# Patient Record
Sex: Male | Born: 1960 | Race: White | Hispanic: No | Marital: Married | Smoking: Never smoker
Health system: Southern US, Community
[De-identification: ages and names within clinical notes are randomized; demographics above are authoritative.]

## PROBLEM LIST (undated history)

## (undated) DIAGNOSIS — I10 Essential (primary) hypertension: Secondary | ICD-10-CM

## (undated) DIAGNOSIS — E785 Hyperlipidemia, unspecified: Secondary | ICD-10-CM

## (undated) DIAGNOSIS — F419 Anxiety disorder, unspecified: Secondary | ICD-10-CM

## (undated) DIAGNOSIS — E119 Type 2 diabetes mellitus without complications: Secondary | ICD-10-CM

## (undated) HISTORY — PX: COLONOSCOPY: SHX174

## (undated) HISTORY — DX: Hyperlipidemia, unspecified: E78.5

## (undated) HISTORY — PX: POLYPECTOMY: SHX149

---

## 2004-09-20 ENCOUNTER — Ambulatory Visit: Payer: Self-pay | Admitting: Family Medicine

## 2009-09-10 ENCOUNTER — Ambulatory Visit: Payer: Self-pay | Admitting: Unknown Physician Specialty

## 2009-09-10 LAB — HM COLONOSCOPY

## 2014-03-19 LAB — TSH: TSH: 2.76 u[IU]/mL (ref <=5.90)

## 2014-03-19 LAB — PSA: PSA: 0.4

## 2014-07-02 LAB — HEMOGLOBIN A1C: Hgb A1c MFr Bld: 6.6 % — AB (ref 4.0–6.0)

## 2014-08-03 LAB — BASIC METABOLIC PANEL
BUN: 17 mg/dL (ref 4–21)
Creatinine: 0.9 mg/dL (ref <=1.3)
Glucose: 161 mg/dL
POTASSIUM: 5 mmol/L (ref 3.4–5.3)
Sodium: 139 mmol/L (ref 137–147)

## 2014-10-05 LAB — LIPID PANEL
Cholesterol: 158 mg/dL (ref 0–200)
HDL: 47 mg/dL (ref 35–70)
LDL CALC: 87 mg/dL
LDL/HDL RATIO: 1.9
Triglycerides: 120 mg/dL (ref 40–160)

## 2014-10-05 LAB — HEPATIC FUNCTION PANEL
ALT: 47 U/L — AB (ref 10–40)
AST: 24 U/L (ref 14–40)
Alkaline Phosphatase: 75 U/L (ref 25–125)
BILIRUBIN, TOTAL: 0.5 mg/dL

## 2014-10-08 DIAGNOSIS — E785 Hyperlipidemia, unspecified: Secondary | ICD-10-CM | POA: Insufficient documentation

## 2014-10-08 DIAGNOSIS — F419 Anxiety disorder, unspecified: Secondary | ICD-10-CM | POA: Insufficient documentation

## 2014-10-08 DIAGNOSIS — E119 Type 2 diabetes mellitus without complications: Secondary | ICD-10-CM | POA: Insufficient documentation

## 2014-10-08 DIAGNOSIS — N529 Male erectile dysfunction, unspecified: Secondary | ICD-10-CM | POA: Insufficient documentation

## 2014-12-15 ENCOUNTER — Ambulatory Visit: Payer: Self-pay | Admitting: Family Medicine

## 2014-12-24 ENCOUNTER — Ambulatory Visit (INDEPENDENT_AMBULATORY_CARE_PROVIDER_SITE_OTHER): Payer: 59 | Admitting: Family Medicine

## 2014-12-24 ENCOUNTER — Encounter: Payer: Self-pay | Admitting: Family Medicine

## 2014-12-24 VITALS — BP 138/92 | HR 72 | Temp 97.9°F | Resp 14 | Ht 66.25 in | Wt 176.0 lb

## 2014-12-24 DIAGNOSIS — N529 Male erectile dysfunction, unspecified: Secondary | ICD-10-CM

## 2014-12-24 DIAGNOSIS — R7303 Prediabetes: Secondary | ICD-10-CM

## 2014-12-24 DIAGNOSIS — K648 Other hemorrhoids: Secondary | ICD-10-CM

## 2014-12-24 DIAGNOSIS — R7309 Other abnormal glucose: Secondary | ICD-10-CM

## 2014-12-24 DIAGNOSIS — E785 Hyperlipidemia, unspecified: Secondary | ICD-10-CM

## 2014-12-24 DIAGNOSIS — F419 Anxiety disorder, unspecified: Secondary | ICD-10-CM

## 2014-12-24 DIAGNOSIS — K6289 Other specified diseases of anus and rectum: Secondary | ICD-10-CM

## 2014-12-24 DIAGNOSIS — N528 Other male erectile dysfunction: Secondary | ICD-10-CM

## 2014-12-24 LAB — POCT UA - MICROALBUMIN: Microalbumin Ur, POC: 50 mg/L

## 2014-12-24 LAB — POCT GLYCOSYLATED HEMOGLOBIN (HGB A1C): Hemoglobin A1C: 6.4

## 2014-12-24 MED ORDER — LOSARTAN POTASSIUM 50 MG PO TABS
50.0000 mg | ORAL_TABLET | Freq: Every day | ORAL | Status: DC
Start: 2014-12-24 — End: 2015-09-24

## 2014-12-24 NOTE — Progress Notes (Signed)
Patient ID: Jason Glenn, male   DOB: [birth year], 54 y.o.   MRN: [id number]    Subjective:  HPI   Diabetes Mellitus Type II, Follow-up:   Lab Results  Component Value Date   HGBA1C 6.6* 07/02/2014    Last seen for diabetes 6 months ago.  Management changes included none. He reports good compliance with treatment. Not on any medications. Current symptoms include none and have been stable. Home blood sugar records: not been checked  Episodes of hypoglycemia? no   Current Insulin Regimen: none Most Recent Eye Exam: over a year ago per patient. Weight trend: stable Prior visit with dietician: yes years ago per patient. Current diet: does not have a specific diet but tries to eat better. Current exercise: active at work with walking but not a set routine of exercise.  Pertinent Labs:    Component Value Date/Time   CHOL 158 10/05/2014   TRIG 120 10/05/2014   CREATININE 0.9 08/03/2014    Wt Readings from Last 3 Encounters:  12/24/14 176 lb (79.833 kg)  10/06/14 181 lb (82.101 kg)    Elevated liver enzymes follow up: Last Hepatic check was in May 2016-liver enzymes were better, ALT was still up some. He does not drink alcohol, he uses tylenol products rarely.    Rectal pain:  Patient states he still is having soreness in his rectal area that he discussed with [place] in May.   This comes and goes still. No burning, no itching, no hemorrhoids that he can tell, no bleeding, no specific pattern to it, his bowels have been normal and having a bowel movement actually helps the soreness.  Patient states on the scale of 1 to 10 sometimes pain goes up to 7 and today right now it is about a 3.  His last colonoscopy was in April 2011 and he had internal hemorrhoids and tubular adenoma and was advised to repeat colonoscopy in 5 years.  Patient wanted to discuss that.    Prior to Admission medications   Medication Sig Start Date End Date Taking? Authorizing Provider  aspirin 81 MG  tablet Take 81 mg by mouth daily.   Yes Historical Provider, MD  atorvastatin (LIPITOR) 10 MG tablet Take by mouth. 09/15/14  Yes Historical Provider, MD  Omega-3 Fatty Acids (FISH OIL) 1000 MG CPDR Take by mouth.   Yes Historical Provider, MD  Potassium Gluconate 550 MG TABS Take by mouth.   Yes Historical Provider, MD  sildenafil (VIAGRA) 50 MG tablet Take by mouth. 12/28/11  Yes Historical Provider, MD  tadalafil (CIALIS) 5 MG tablet Take by mouth. 02/26/14  Yes Historical Provider, MD    Patient Active Problem List   Diagnosis Date Noted  . Anxiety 10/08/2014  . 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 HLD (hyperlipidemia) 10/08/2014  . Diabetes mellitus, type 2 10/08/2014    No past medical history on file.  Social History   Social History  . Marital Status: Married    Spouse Name: N/A  . Number of Children: N/A  . Years of Education: N/A   Occupational History  . Not on file.   Social History Main Topics  . Smoking status: Never Smoker   . Smokeless tobacco: Never Used  . Alcohol Use: No  . Drug Use: No  . Sexual Activity: Yes   Other Topics Concern  . Not on file   Social History Narrative    Allergies  Allergen Reactions  . Pravastatin Sodium  Other reaction(s): Muscle Cramps    Review of Systems  Constitutional: Negative for fever, chills and malaise/fatigue.  Respiratory: Negative for cough, hemoptysis, sputum production, shortness of breath and wheezing.   Cardiovascular: Negative for chest pain, palpitations and claudication.  Gastrointestinal: Negative for heartburn, nausea, abdominal pain and blood in stool.  Genitourinary: Negative for dysuria, urgency and frequency.  Musculoskeletal: Negative for myalgias, back pain, joint pain, falls and neck pain.  Skin: Negative for itching and rash.  Neurological: Negative for dizziness, tingling, tremors, weakness and headaches.    Immunization History  Administered Date(s) Administered   . Td 12/02/2003, 12/09/2012   Objective:  BP 138/92 mmHg  Pulse 72  Temp(Src) 97.9 F (36.6 C)  Resp 14  Ht 5' 6.25" (1.683 m)  Wt 176 lb (79.833 kg)  BMI 28.18 kg/m2  Physical Exam  Constitutional: He is oriented to person, place, and time and well-developed, well-nourished, and in no distress.  HENT:  Head: Normocephalic and atraumatic.  Eyes: Conjunctivae are normal. Pupils are equal, round, and reactive to light.  Neck: Normal range of motion. Neck supple.  Cardiovascular: Normal rate, regular rhythm, normal heart sounds and intact distal pulses.  Exam reveals no gallop and no friction rub.   No murmur heard. Pulmonary/Chest: Effort normal and breath sounds normal. No respiratory distress. He has no wheezes. He has no rales. He exhibits no tenderness.  Abdominal: Soft. Bowel sounds are normal. He exhibits no distension and no mass. There is no tenderness. There is no rebound and no guarding.  Genitourinary: Rectal exam shows internal hemorrhoid.  Musculoskeletal: Normal range of motion. He exhibits no edema or tenderness.  Neurological: He is alert and oriented to person, place, and time. Gait normal.  Skin: No rash noted. No erythema.  Psychiatric: Mood, memory, affect and judgment normal.   internal hemorrhoid noted on visual exam exam of the anus. There is no active bleeding. No fissure noted. No DRE performed. Nothing on exam that would definitively explain anal pain/discomfort  Lab Results  Component Value Date   CHOL 158 10/05/2014   TRIG 120 10/05/2014   HDL 47 10/05/2014   LDLCALC 87 10/05/2014   TSH 2.76 03/19/2014   PSA 0.4 03/19/2014   HGBA1C 6.6* 07/02/2014    CMP     Component Value Date/Time   NA 139 08/03/2014   K 5.0 08/03/2014   BUN 17 08/03/2014   CREATININE 0.9 08/03/2014   AST 24 10/05/2014   ALT 47* 10/05/2014   ALKPHOS 75 10/05/2014    Assessment and Plan :  1. Prediabetes A1C 6.4 better today. Continue working on habits. He is pre  diabetic now. Urine microalbumin is 50 today will add Losartan 50 mg. - POCT HgB A1C - POCT UA - Microalbumin - COMPLETE METABOLIC PANEL WITH GFR - losartan (COZAAR) 50 MG tablet; Take 1 tablet (50 mg total) by mouth daily.  Dispense: 90 tablet; Refill: 3  2. Anxiety Stable. Chronic. - COMPLETE METABOLIC PANEL WITH GFR - TSH  3. ED (erectile dysfunction) of organic origin Stable. Uses Cialis and Viagra as needed.  4. Rectal pain Off and on. He due for colonoscopy also. Will refer back to GI-Dr. Shelle Iron and also since he is having rectal issues. Patient has suppositories at home and advised patient to use that and also take hot bath instead of shower for a few days. - Ambulatory referral to Gastroenterology  5. Internal hemorrhoid  On the exam today. Refer to GI. - Ambulatory referral to Gastroenterology -  CBC with Differential/Platelet  6. Hyperlipidemia Hx of this. - Lipid Panel With LDL/HDL Ratio      Patient was seen and examined by Dr. Bosie Clos and note was scribed by Samara Deist, RMA.    Julieanne Manson MD [facility] 12/24/2014 8:47 AM

## 2014-12-25 LAB — COMPREHENSIVE METABOLIC PANEL
ALK PHOS: 66 IU/L (ref 39–117)
ALT: 44 IU/L (ref 0–44)
AST: 24 IU/L (ref 0–40)
Albumin/Globulin Ratio: 1.6 (ref 1.1–2.5)
Albumin: 4.6 g/dL (ref 3.5–5.5)
BUN / CREAT RATIO: 17 (ref 9–20)
BUN: 17 mg/dL (ref 6–24)
Bilirubin Total: 0.9 mg/dL (ref 0.0–1.2)
CALCIUM: 9 mg/dL (ref 8.7–10.2)
CHLORIDE: 99 mmol/L (ref 97–108)
CO2: 24 mmol/L (ref 18–29)
Creatinine, Ser: 0.98 mg/dL (ref 0.76–1.27)
GFR calc Af Amer: 99 mL/min/{1.73_m2} (ref >59)
GFR calc non Af Amer: 86 mL/min/{1.73_m2} (ref >59)
Globulin, Total: 2.8 g/dL (ref 1.5–4.5)
Glucose: 138 mg/dL — ABNORMAL HIGH (ref 65–99)
Potassium: 5 mmol/L (ref 3.5–5.2)
SODIUM: 139 mmol/L (ref 134–144)
Total Protein: 7.4 g/dL (ref 6.0–8.5)

## 2014-12-25 LAB — CBC WITH DIFFERENTIAL/PLATELET
BASOS ABS: 0 10*3/uL (ref 0.0–0.2)
BASOS: 1 %
EOS (ABSOLUTE): 0.3 10*3/uL (ref 0.0–0.4)
Eos: 4 %
Hematocrit: 43.3 % (ref 37.5–51.0)
Hemoglobin: 14.8 g/dL (ref 12.6–17.7)
IMMATURE GRANS (ABS): 0 10*3/uL (ref 0.0–0.1)
Immature Granulocytes: 0 %
LYMPHS: 40 %
Lymphocytes Absolute: 2.5 10*3/uL (ref 0.7–3.1)
MCH: 30.1 pg (ref 26.6–33.0)
MCHC: 34.2 g/dL (ref 31.5–35.7)
MCV: 88 fL (ref 79–97)
Monocytes Absolute: 0.4 10*3/uL (ref 0.1–0.9)
Monocytes: 7 %
NEUTROS PCT: 48 %
Neutrophils Absolute: 3 10*3/uL (ref 1.4–7.0)
Platelets: 249 10*3/uL (ref 150–379)
RBC: 4.91 x10E6/uL (ref 4.14–5.80)
RDW: 13.6 % (ref 12.3–15.4)
WBC: 6.1 10*3/uL (ref 3.4–10.8)

## 2014-12-25 LAB — LIPID PANEL WITH LDL/HDL RATIO
Cholesterol, Total: 201 mg/dL — ABNORMAL HIGH (ref 100–199)
HDL: 51 mg/dL (ref >39)
LDL Calculated: 134 mg/dL — ABNORMAL HIGH (ref 0–99)
LDl/HDL Ratio: 2.6 ratio units (ref 0.0–3.6)
Triglycerides: 81 mg/dL (ref 0–149)
VLDL Cholesterol Cal: 16 mg/dL (ref 5–40)

## 2014-12-25 LAB — TSH: TSH: 2.27 u[IU]/mL (ref 0.450–4.500)

## 2015-03-23 LAB — HM COLONOSCOPY

## 2015-04-22 ENCOUNTER — Encounter: Payer: Self-pay | Admitting: Family Medicine

## 2015-04-22 ENCOUNTER — Ambulatory Visit (INDEPENDENT_AMBULATORY_CARE_PROVIDER_SITE_OTHER): Payer: 59 | Admitting: Family Medicine

## 2015-04-22 VITALS — BP 116/72 | HR 65 | Temp 98.3°F | Resp 16 | Wt 180.4 lb

## 2015-04-22 DIAGNOSIS — B029 Zoster without complications: Secondary | ICD-10-CM | POA: Diagnosis not present

## 2015-04-22 MED ORDER — VALACYCLOVIR HCL 1 G PO TABS: 1000.0000 mg | ORAL_TABLET | Freq: Three times a day (TID) | ORAL | Status: DC

## 2015-04-22 NOTE — Patient Instructions (Signed)
Let me know if you have worsening of your nerve pain.

## 2015-04-22 NOTE — Progress Notes (Signed)
Subjective:     Patient ID: Jason Glenn, male   DOB: 09/19/1960, 54 y.o.   MRN: [id number]  HPI  Chief Complaint  Patient presents with  . Skin Problem    Patient comes in office with concerns of multiple lesions on skin that appeared 3-4days ago. Patient reports that he initially thought there were pimples, but area now is raised, sore and red. Patient has a lesion on his chest and 2 on his left upper arm.   States they have a burning sensation but not keeping him up at night. Reports his wife has had shingles in the past and this is similar.  Review of Systems  Skin:       No exposure to or hx of MRSA.       Objective:   Physical Exam  Constitutional: He appears well-developed and well-nourished.  Skin:  Left chest and left proximal upper arm with patch of erythematous rash with overlying vesicles.       Assessment:    1. Shingles - valACYclovir (VALTREX) 1000 MG tablet; Take 1 tablet (1,000 mg total) by mouth 3 (three) times daily.  Dispense: 21 tablet; Refill: 0    Plan:    Discussed infection control measures. Will call if sx worsen.

## 2015-05-03 ENCOUNTER — Ambulatory Visit (INDEPENDENT_AMBULATORY_CARE_PROVIDER_SITE_OTHER): Payer: 59 | Admitting: Family Medicine

## 2015-05-03 ENCOUNTER — Telehealth: Payer: Self-pay | Admitting: Family Medicine

## 2015-05-03 ENCOUNTER — Encounter: Payer: Self-pay | Admitting: Family Medicine

## 2015-05-03 VITALS — BP 138/88 | HR 60 | Temp 97.8°F | Resp 14 | Wt 179.0 lb

## 2015-05-03 DIAGNOSIS — E785 Hyperlipidemia, unspecified: Secondary | ICD-10-CM

## 2015-05-03 DIAGNOSIS — N528 Other male erectile dysfunction: Secondary | ICD-10-CM

## 2015-05-03 DIAGNOSIS — E119 Type 2 diabetes mellitus without complications: Secondary | ICD-10-CM | POA: Diagnosis not present

## 2015-05-03 DIAGNOSIS — H9313 Tinnitus, bilateral: Secondary | ICD-10-CM

## 2015-05-03 DIAGNOSIS — N529 Male erectile dysfunction, unspecified: Secondary | ICD-10-CM

## 2015-05-03 LAB — POCT GLYCOSYLATED HEMOGLOBIN (HGB A1C): HEMOGLOBIN A1C: 7.1

## 2015-05-03 MED ORDER — GLUCOSE BLOOD VI STRP: ORAL_STRIP | Status: DC

## 2015-05-03 MED ORDER — ATORVASTATIN CALCIUM 40 MG PO TABS: 40.0000 mg | ORAL_TABLET | Freq: Every day | ORAL | Status: DC

## 2015-05-03 NOTE — Progress Notes (Signed)
Patient ID: Jason Glenn, male   DOB: [name], 54 y.o.   MRN: [id number]    Subjective:  HPI  Diabetes follow up: Patient was last seen in August. Losartan was started due to urine microalbumin been 50 at that time. He has not been checking his sugars due to not having strips but used his wife's the other day and sugar was 136. Lab Results  Component Value Date   HGBA1C 6.4 12/24/2014   Ringing in the ears Patient states this has been going on for years and we have referred him to ENT for this but patient states doctor there told him he could not help him and he has not seen an audiologist. He states his aunt use to work for a doctor who is out of state and per patient specialized in medicine and patient was advised to try Lipo-flavonoid tablets, he is suppose to take 1 tablet with meals but he has not been taking it that way. He has been taking it off and on for 2 months. He has noticed some improvement with taking this medication.  Prior to Admission medications   Medication Sig Start Date End Date Taking? Authorizing Provider  aspirin 81 MG tablet Take 81 mg by mouth daily.   Yes Historical Provider, MD  atorvastatin (LIPITOR) 10 MG tablet Take by mouth. 09/15/14  Yes Historical Provider, MD  losartan (COZAAR) 50 MG tablet Take 1 tablet (50 mg total) by mouth daily. 12/24/14  Yes Richard Hulen ShoutsL Gilbert Jr., MD  Omega-3 Fatty Acids (FISH OIL) 1000 MG CPDR Take by mouth.   Yes Historical Provider, MD  polyethylene glycol powder (GLYCOLAX/MIRALAX) powder  02/24/15  Yes Historical Provider, MD  Potassium Gluconate 550 MG TABS Take by mouth.   Yes Historical Provider, MD  sildenafil (VIAGRA) 50 MG tablet Take by mouth. 12/28/11  Yes Historical Provider, MD  tadalafil (CIALIS) 5 MG tablet Take by mouth. 02/26/14  Yes Historical Provider, MD  valACYclovir (VALTREX) 1000 MG tablet Take 1 tablet (1,000 mg total) by mouth 3 (three) times daily. 04/22/15  Yes Anola Gurneyobert Chauvin, PA  Vitamins-Lipotropics  (LIPO-FLAVONOID PLUS) TABS Take by mouth once.   Yes Historical Provider, MD    Patient Active Problem List   Diagnosis Date Noted  . Anxiety 10/08/2014  . 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 HLD (hyperlipidemia) 10/08/2014  . Diabetes mellitus, type 2 (HCC) 10/08/2014    Past Medical History  Diagnosis Date  . Hyperlipidemia     Social History   Social History  . Marital Status: Married    Spouse Name: N/A  . Number of Children: N/A  . Years of Education: N/A   Occupational History  . Not on file.   Social History Main Topics  . Smoking status: Never Smoker   . Smokeless tobacco: Never Used  . Alcohol Use: No  . Drug Use: No  . Sexual Activity: Yes   Other Topics Concern  . Not on file   Social History Narrative    Allergies  Allergen Reactions  . Pravastatin Sodium     Other reaction(s): Muscle Cramps    Review of Systems  Constitutional: Negative.   HENT: Positive for tinnitus.   Respiratory: Negative.   Cardiovascular: Negative.   Gastrointestinal: Negative.   Musculoskeletal: Negative.   Neurological: Negative.   Psychiatric/Behavioral: Negative.     Immunization History  Administered Date(s) Administered  . Td 12/02/2003, 12/09/2012   Objective:  BP 138/88 mmHg  Pulse 60  Temp(Src) 97.8 F (36.6 C)  Resp 14  Wt 179 lb (81.194 kg)  Physical Exam  Constitutional: He is oriented to person, place, and time and well-developed, well-nourished, and in no distress.  HENT:  Head: Normocephalic and atraumatic.  Right Ear: External ear normal.  Left Ear: External ear normal.  Nose: Nose normal.  Eyes: Conjunctivae are normal. Pupils are equal, round, and reactive to light. Right eye exhibits nystagmus (mild). Left eye exhibits nystagmus (mild).  Neck: Normal range of motion. Neck supple.  Cardiovascular: Normal rate, regular rhythm, normal heart sounds and intact distal pulses.   No murmur heard. Pulmonary/Chest: Effort  normal and breath sounds normal. No respiratory distress. He has no wheezes.  Abdominal: Soft. Bowel sounds are normal. He exhibits no distension and no mass. There is no tenderness.  Musculoskeletal: Normal range of motion. He exhibits no edema or tenderness.  Neurological: He is alert and oriented to person, place, and time. Gait normal.  Skin: Skin is warm and dry.  Psychiatric: Mood, memory, affect and judgment normal.    Lab Results  Component Value Date   WBC 6.1 12/24/2014   HCT 43.3 12/24/2014   GLUCOSE 138* 12/24/2014   CHOL 201* 12/24/2014   TRIG 81 12/24/2014   HDL 51 12/24/2014   LDLCALC 134* 12/24/2014   TSH 2.270 12/24/2014   PSA 0.4 03/19/2014   HGBA1C 6.4 12/24/2014    CMP     Component Value Date/Time   NA 139 12/24/2014 0936   K 5.0 12/24/2014 0936   CL 99 12/24/2014 0936   CO2 24 12/24/2014 0936   GLUCOSE 138* 12/24/2014 0936   BUN 17 12/24/2014 0936   CREATININE 0.98 12/24/2014 0936   CREATININE 0.9 08/03/2014   CALCIUM 9.0 12/24/2014 0936   PROT 7.4 12/24/2014 0936   ALBUMIN 4.6 12/24/2014 0936   AST 24 12/24/2014 0936   ALT 44 12/24/2014 0936   ALKPHOS 66 12/24/2014 0936   BILITOT 0.9 12/24/2014 0936   GFRNONAA 86 12/24/2014 0936   GFRAA 99 12/24/2014 0936    Assessment and Plan :  1. Type 2 diabetes mellitus without complication, without long-term current use of insulin (HCC) A1C 7.1 was 6.4. Worse. Advised patient to check sugar at home and will follow for now. - POCT HgB A1C  2. HLD (hyperlipidemia) Will increase Lipitor to 40 mg since his sugar increased and levels still elevated for his cholesterol.  3. ED (erectile dysfunction) of organic origin Stable.  4. Tinnitus, bilateral Discussed this with patient in detail.. Encouraged patient to continue the Lipo medication regularly. Will follow as needed.  Patient denies decreased hearing. Consider ENT or audiology referral. I have done the exam and reviewed the above chart and it is  accurate to the best of my knowledge.   Patient was seen and examined by Dr. Bosie Clos and note was scribed by Samara Deist, RMA.    Julieanne Manson MD [facility] 05/03/2015 8:46 AM

## 2015-05-03 NOTE — Telephone Encounter (Signed)
Pt stated he needs orders for testing strips for his One Touch Ultra to be sent to [facility]. Thanks TNP

## 2015-05-03 NOTE — Patient Instructions (Signed)
Wait to get flu shot in about 2 to 3 weeks from now due to recent shingels break out.

## 2015-05-03 NOTE — Telephone Encounter (Signed)
RX sent in-aa 

## 2015-05-26 LAB — HM DIABETES EYE EXAM

## 2015-07-30 ENCOUNTER — Other Ambulatory Visit: Payer: Self-pay | Admitting: Family Medicine

## 2015-07-30 NOTE — Telephone Encounter (Signed)
Please review-aa 

## 2015-07-30 NOTE — Telephone Encounter (Signed)
Pt contacted office for refill request on the following medications:  sildenafil (VIAGRA) 50 MG tablet.  [facility] [address]  ZO#[phone]/WJCB#[phone]/MW

## 2015-07-31 NOTE — Telephone Encounter (Signed)
Ok for 1 year. 

## 2015-08-02 MED ORDER — SILDENAFIL CITRATE 50 MG PO TABS: 50.0000 mg | ORAL_TABLET | ORAL | Status: DC | PRN

## 2015-08-02 NOTE — Telephone Encounter (Signed)
Done-aa 

## 2015-08-10 ENCOUNTER — Ambulatory Visit (INDEPENDENT_AMBULATORY_CARE_PROVIDER_SITE_OTHER): Payer: 59 | Admitting: Family Medicine

## 2015-08-10 VITALS — BP 130/78 | HR 68 | Temp 97.9°F | Resp 16 | Wt 176.0 lb

## 2015-08-10 DIAGNOSIS — R0981 Nasal congestion: Secondary | ICD-10-CM

## 2015-08-10 DIAGNOSIS — E785 Hyperlipidemia, unspecified: Secondary | ICD-10-CM | POA: Diagnosis not present

## 2015-08-10 DIAGNOSIS — M791 Myalgia, unspecified site: Secondary | ICD-10-CM

## 2015-08-10 DIAGNOSIS — J0120 Acute ethmoidal sinusitis, unspecified: Secondary | ICD-10-CM | POA: Diagnosis not present

## 2015-08-10 DIAGNOSIS — E119 Type 2 diabetes mellitus without complications: Secondary | ICD-10-CM

## 2015-08-10 MED ORDER — AMOXICILLIN-POT CLAVULANATE 875-125 MG PO TABS: 1.0000 | ORAL_TABLET | Freq: Two times a day (BID) | ORAL | Status: DC

## 2015-08-10 NOTE — Progress Notes (Signed)
Patient ID: Jason Glenn, male   DOB: 10-17-1960, 55 y.o.   MRN: [id number]   Jason Glenn  MRN: [id number] DOB: 10-02-1960  Subjective:  HPI  1. Type 2 diabetes mellitus without complication, without long-term current use of insulin (HCC) The patient is a newly diagnosed diabetic.  His last visit was on 05/03/15 and that is when he was diagnosed.  His A1C was 7.1 at that time.  He has been checking his glucose and it has been running 140's-190's.  The patient has not had any hypoglycemic events since we last saw him.    2. Hyperlipidemia Since becoming diabetic the patient had been instructed to increase his Lipitor to 40 mg daily.    3. Sinus congestion The patient complains of sinus congestion and pressure today.  He first developed symptoms 3 weeks ago but yesterday he was running a fever and he felt like the congestion was settling in his chest.   Patient Active Problem List   Diagnosis Date Noted  . Anxiety 10/08/2014  . 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 HLD (hyperlipidemia) 10/08/2014  . Diabetes mellitus, type 2 (HCC) 10/08/2014    Past Medical History  Diagnosis Date  . Hyperlipidemia     Social History   Social History  . Marital Status: Married    Spouse Name: N/A  . Number of Children: N/A  . Years of Education: N/A   Occupational History  . Not on file.   Social History Main Topics  . Smoking status: Never Smoker   . Smokeless tobacco: Never Used  . Alcohol Use: No  . Drug Use: No  . Sexual Activity: Yes   Other Topics Concern  . Not on file   Social History Narrative    Outpatient Prescriptions Prior to Visit  Medication Sig Dispense Refill  . aspirin 81 MG tablet Take 81 mg by mouth daily.    Marland Kitchen atorvastatin (LIPITOR) 40 MG tablet Take 1 tablet (40 mg total) by mouth at bedtime. 90 tablet 2  . glucose blood test strip Check sugar once daily DX: E11.9 100 each 3  . losartan (COZAAR) 50 MG tablet Take 1 tablet (50 mg  total) by mouth daily. 90 tablet 3  . Omega-3 Fatty Acids (FISH OIL) 1000 MG CPDR Take by mouth.    . polyethylene glycol powder (GLYCOLAX/MIRALAX) powder     . Potassium Gluconate 550 MG TABS Take by mouth.    . sildenafil (VIAGRA) 50 MG tablet Take 1 tablet (50 mg total) by mouth as needed for erectile dysfunction. 10 tablet 12  . valACYclovir (VALTREX) 1000 MG tablet Take 1 tablet (1,000 mg total) by mouth 3 (three) times daily. 21 tablet 0  . tadalafil (CIALIS) 5 MG tablet Take by mouth.    . Vitamins-Lipotropics (LIPO-FLAVONOID PLUS) TABS Take by mouth once.     No facility-administered medications prior to visit.    Allergies  Allergen Reactions  . Pravastatin Sodium     Other reaction(s): Muscle Cramps    Review of Systems  Constitutional: Positive for fever and malaise/fatigue. Negative for chills and diaphoresis.  HENT: Positive for congestion, hearing loss (chronic) and tinnitus (chronic). Negative for ear discharge, ear pain, nosebleeds and sore throat.   Eyes: Negative for blurred vision, double vision, photophobia, pain, discharge and redness.  Respiratory: Positive for cough and sputum production. Negative for hemoptysis, shortness of breath and wheezing.   Cardiovascular: Negative for chest pain, palpitations, orthopnea and leg swelling.  Neurological: Positive for weakness. Negative for headaches.   Objective:  BP 130/78 mmHg  Pulse 68  Temp(Src) 97.9 F (36.6 C) (Oral)  Resp 16  Wt 176 lb (79.833 kg)  Physical Exam  Constitutional: He is oriented to person, place, and time and well-developed, well-nourished, and in no distress.  HENT:  Head: Normocephalic and atraumatic.  Right Ear: External ear normal.  Left Ear: External ear normal.  Nose: Nose normal.  Mouth/Throat: Oropharynx is clear and moist.  ethmoid sinus tenderness  Eyes: Conjunctivae and EOM are normal. Pupils are equal, round, and reactive to light.  Neck: Normal range of motion. Neck supple.    Cardiovascular: Normal rate, regular rhythm, normal heart sounds and intact distal pulses.   Pulmonary/Chest: Effort normal and breath sounds normal.  Abdominal: Soft.  Musculoskeletal: He exhibits no edema.  Neurological: He is alert and oriented to person, place, and time. Gait normal.  Skin: Skin is warm and dry.  Psychiatric: Mood, memory, affect and judgment normal.    Assessment and Plan :  Type 2 diabetes mellitus without complication, without long-term current use of insulin (HCC)  Hyperlipidemia  Sinus congestion Ethmoid sinusitis Treat with Augmentin for 10 days. Encouraged fluids and rest. OTC Robitussin. I have done the exam and reviewed the above chart and it is accurate to the best of my knowledge.  Julieanne Mansonichard Rami Budhu MD [facility] 08/10/2015 8:25 AM

## 2015-08-11 ENCOUNTER — Telehealth: Payer: Self-pay

## 2015-08-11 LAB — COMPREHENSIVE METABOLIC PANEL
A/G RATIO: 1.8 (ref 1.2–2.2)
ALK PHOS: 70 IU/L (ref 39–117)
ALT: 32 IU/L (ref 0–44)
AST: 17 IU/L (ref 0–40)
Albumin: 4.6 g/dL (ref 3.5–5.5)
BUN/Creatinine Ratio: 15 (ref 9–20)
BUN: 13 mg/dL (ref 6–24)
Bilirubin Total: 1.2 mg/dL (ref 0.0–1.2)
CALCIUM: 9.4 mg/dL (ref 8.7–10.2)
CO2: 24 mmol/L (ref 18–29)
CREATININE: 0.86 mg/dL (ref 0.76–1.27)
Chloride: 100 mmol/L (ref 96–106)
GFR calc Af Amer: 111 mL/min/{1.73_m2} (ref >59)
GFR, EST NON AFRICAN AMERICAN: 96 mL/min/{1.73_m2} (ref >59)
Globulin, Total: 2.5 g/dL (ref 1.5–4.5)
Glucose: 149 mg/dL — ABNORMAL HIGH (ref 65–99)
POTASSIUM: 4.8 mmol/L (ref 3.5–5.2)
Sodium: 140 mmol/L (ref 134–144)
Total Protein: 7.1 g/dL (ref 6.0–8.5)

## 2015-08-11 LAB — LIPID PANEL WITH LDL/HDL RATIO
CHOLESTEROL TOTAL: 151 mg/dL (ref 100–199)
HDL: 50 mg/dL (ref >39)
LDL Calculated: 81 mg/dL (ref 0–99)
LDl/HDL Ratio: 1.6 ratio units (ref 0.0–3.6)
TRIGLYCERIDES: 101 mg/dL (ref 0–149)
VLDL Cholesterol Cal: 20 mg/dL (ref 5–40)

## 2015-08-11 LAB — HEMOGLOBIN A1C
Est. average glucose Bld gHb Est-mCnc: 160 mg/dL
Hgb A1c MFr Bld: 7.2 % — ABNORMAL HIGH (ref 4.8–5.6)

## 2015-08-11 LAB — CK: CK TOTAL: 81 U/L (ref 24–204)

## 2015-08-11 LAB — TSH: TSH: 2.07 u[IU]/mL (ref 0.450–4.500)

## 2015-08-11 NOTE — Telephone Encounter (Signed)
Pt advised as directed below.   Thanks,   -Makinlee Awwad  

## 2015-08-11 NOTE — Telephone Encounter (Signed)
-----   Message from Maple Hudsonichard L Gilbert Jr., MD sent at 08/11/2015  1:43 PM EDT ----- Labs good. Cholesterol much better.

## 2015-09-24 ENCOUNTER — Other Ambulatory Visit: Payer: Self-pay | Admitting: Family Medicine

## 2015-11-25 ENCOUNTER — Encounter: Payer: Self-pay | Admitting: Family Medicine

## 2015-11-25 ENCOUNTER — Ambulatory Visit (INDEPENDENT_AMBULATORY_CARE_PROVIDER_SITE_OTHER): Payer: 59 | Admitting: Family Medicine

## 2015-11-25 VITALS — BP 120/66 | HR 68 | Temp 97.7°F | Resp 16 | Wt 177.0 lb

## 2015-11-25 DIAGNOSIS — E119 Type 2 diabetes mellitus without complications: Secondary | ICD-10-CM | POA: Diagnosis not present

## 2015-11-25 DIAGNOSIS — L989 Disorder of the skin and subcutaneous tissue, unspecified: Secondary | ICD-10-CM

## 2015-11-25 LAB — POCT GLYCOSYLATED HEMOGLOBIN (HGB A1C): HEMOGLOBIN A1C: 7.4

## 2015-11-25 MED ORDER — METFORMIN HCL 500 MG PO TABS: 500.0000 mg | ORAL_TABLET | Freq: Every day | ORAL | Status: DC

## 2015-11-25 NOTE — Progress Notes (Signed)
Patient ID: Jason Glenn, male   DOB: 10/05/1960, 55 y.o.   MRN: [id number]    Subjective:  HPI  Diabetes Mellitus Type II, Follow-up:   Lab Results  Component Value Date   HGBA1C 7.2* 08/10/2015   HGBA1C 7.1 05/03/2015   HGBA1C 6.4 12/24/2014    Last seen for diabetes 4 months ago.  Management since then includes none. He reports good compliance with treatment. He is not having side effects.  Current symptoms include none and have been unchanged. Home blood sugar records: 130's -180's but usually when high he has eaten something sweet.   Episodes of hypoglycemia? no   Current Insulin Regimen: n/a Most Recent Eye Exam: a year ago Current exercise: no regular exercise but does have an active job.  Pertinent Labs:    Component Value Date/Time   CHOL 151 08/10/2015 0858   CHOL 158 10/05/2014   TRIG 101 08/10/2015 0858   HDL 50 08/10/2015 0858   HDL 47 10/05/2014   LDLCALC 81 08/10/2015 0858   LDLCALC 87 10/05/2014   CREATININE 0.86 08/10/2015 0858   CREATININE 0.9 08/03/2014    Wt Readings from Last 3 Encounters:  11/25/15 177 lb (80.287 kg)  08/10/15 176 lb (79.833 kg)  05/03/15 179 lb (81.194 kg)    ------------------------------------------------------------------------  Pt has a skin lesion he would like looked at on the right side of his face. He reports that it is a dry place on his face.    Prior to Admission medications   Medication Sig Start Date End Date Taking? Authorizing Provider  aspirin 81 MG tablet Take 81 mg by mouth daily.   Yes Historical Provider, Jason Glenn  atorvastatin (LIPITOR) 40 MG tablet Take 1 tablet (40 mg total) by mouth at bedtime. 05/03/15  Yes Lelania Bia Hulen ShoutsL Trixie Maclaren Jr., Jason Glenn  glucose blood test strip Check sugar once daily DX: E11.9 05/03/15  Yes Maple Hudsonichard L Sabriyah Wilcher Jr., Jason Glenn  losartan (COZAAR) 50 MG tablet Take 1 tablet by mouth  daily 09/24/15  Yes Madisun Hargrove Hulen ShoutsL Araeya Lamb Jr., Jason Glenn  Misc Natural Products (DAILY HERBS PROSTATE PO) Take by mouth.   Yes  Historical Provider, Jason Glenn  Omega-3 Fatty Acids (FISH OIL) 1000 MG CPDR Take by mouth.   Yes Historical Provider, Jason Glenn  polyethylene glycol powder (GLYCOLAX/MIRALAX) powder  02/24/15  Yes Historical Provider, Jason Glenn  Potassium Gluconate 550 MG TABS Take by mouth.   Yes Historical Provider, Jason Glenn  sildenafil (VIAGRA) 50 MG tablet Take 1 tablet (50 mg total) by mouth as needed for erectile dysfunction. 08/02/15  Yes Ellisha Bankson Hulen ShoutsL Jessi Jessop Jr., Jason Glenn  valACYclovir (VALTREX) 1000 MG tablet Take 1 tablet (1,000 mg total) by mouth 3 (three) times daily. 04/22/15  Yes Anola Gurneyobert Chauvin, PA    Patient Active Problem List   Diagnosis Date Noted  . Anxiety 10/08/2014  . 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 HLD (hyperlipidemia) 10/08/2014  . Diabetes mellitus, type 2 (HCC) 10/08/2014    Past Medical History  Diagnosis Date  . Hyperlipidemia     Social History   Social History  . Marital Status: Married    Spouse Name: N/A  . Number of Children: N/A  . Years of Education: N/A   Occupational History  . Not on file.   Social History Main Topics  . Smoking status: Never Smoker   . Smokeless tobacco: Never Used  . Alcohol Use: No  . Drug Use: No  . Sexual Activity: Yes   Other Topics Concern  . Not  on file   Social History Narrative    Allergies  Allergen Reactions  . Pravastatin Sodium     Other reaction(s): Muscle Cramps    Review of Systems  Constitutional: Negative.   HENT: Negative.   Eyes: Negative.   Respiratory: Negative.   Cardiovascular: Negative.   Gastrointestinal: Negative.   Genitourinary: Negative.   Musculoskeletal: Negative.   Skin: Negative for itching and rash.       Dry lesion  Neurological: Negative.   Endo/Heme/Allergies: Negative.   Psychiatric/Behavioral: Negative.     Immunization History  Administered Date(s) Administered  . Td 12/02/2003, 12/09/2012   Objective:  BP 120/66 mmHg  Pulse 68  Temp(Src) 97.7 F (36.5 C) (Oral)  Resp 16   Wt 177 lb (80.287 kg)  Physical Exam  Constitutional: He is oriented to person, place, and time and well-developed, well-nourished, and in no distress.  HENT:  Head: Normocephalic and atraumatic.  Right Ear: External ear normal.  Left Ear: External ear normal.  Nose: Nose normal.  Eyes: Conjunctivae and EOM are normal. Pupils are equal, round, and reactive to light.  Neck: Normal range of motion. Neck supple.  Cardiovascular: Normal rate, regular rhythm, normal heart sounds and intact distal pulses.   Pulmonary/Chest: Effort normal and breath sounds normal.  Abdominal: Soft. Bowel sounds are normal.  Musculoskeletal: Normal range of motion.  Neurological: He is alert and oriented to person, place, and time. He has normal reflexes. Gait normal. GCS score is 15.  Skin: Skin is warm and dry.  Possible pre cancerous lesion on right side of face  Psychiatric: Mood, memory, affect and judgment normal.    Lab Results  Component Value Date   WBC 6.1 12/24/2014   HCT 43.3 12/24/2014   PLT 249 12/24/2014   GLUCOSE 149* 08/10/2015   CHOL 151 08/10/2015   TRIG 101 08/10/2015   HDL 50 08/10/2015   LDLCALC 81 08/10/2015   TSH 2.070 08/10/2015   PSA 0.4 03/19/2014   HGBA1C 7.2* 08/10/2015    CMP     Component Value Date/Time   NA 140 08/10/2015 0858   K 4.8 08/10/2015 0858   CL 100 08/10/2015 0858   CO2 24 08/10/2015 0858   GLUCOSE 149* 08/10/2015 0858   BUN 13 08/10/2015 0858   CREATININE 0.86 08/10/2015 0858   CREATININE 0.9 08/03/2014   CALCIUM 9.4 08/10/2015 0858   PROT 7.1 08/10/2015 0858   ALBUMIN 4.6 08/10/2015 0858   AST 17 08/10/2015 0858   ALT 32 08/10/2015 0858   ALKPHOS 70 08/10/2015 0858   BILITOT 1.2 08/10/2015 0858   GFRNONAA 96 08/10/2015 0858   GFRAA 111 08/10/2015 0858    Assessment and Plan :  1. Type 2 diabetes mellitus without complication, without long-term current use of insulin (HCC) A1C slightly increased. Start metformin and follow up in 3-4  months.  - POCT HgB A1C- 7.4   2. Skin lesion Possible squamous cell carcinoma.  - Ambulatory referral to Dermatology 3. Mild obesity Diet and exercise changes discussed at some length  Patient was seen and examined by Dr. Julieanne Manson, and noted scribed by Dimas Chyle, CMA  Julieanne Manson Jason Glenn [facility] 11/25/2015 3:50 PM

## 2016-03-04 ENCOUNTER — Other Ambulatory Visit: Payer: Self-pay | Admitting: Family Medicine

## 2016-03-23 ENCOUNTER — Encounter: Payer: Self-pay | Admitting: Family Medicine

## 2016-03-23 ENCOUNTER — Ambulatory Visit (INDEPENDENT_AMBULATORY_CARE_PROVIDER_SITE_OTHER): Payer: 59 | Admitting: Family Medicine

## 2016-03-23 VITALS — BP 142/80 | HR 60 | Temp 98.4°F | Resp 12 | Wt 175.0 lb

## 2016-03-23 DIAGNOSIS — E784 Other hyperlipidemia: Secondary | ICD-10-CM

## 2016-03-23 DIAGNOSIS — E7849 Other hyperlipidemia: Secondary | ICD-10-CM

## 2016-03-23 DIAGNOSIS — E119 Type 2 diabetes mellitus without complications: Secondary | ICD-10-CM

## 2016-03-23 DIAGNOSIS — N529 Male erectile dysfunction, unspecified: Secondary | ICD-10-CM | POA: Diagnosis not present

## 2016-03-23 DIAGNOSIS — R5383 Other fatigue: Secondary | ICD-10-CM

## 2016-03-23 LAB — POCT GLYCOSYLATED HEMOGLOBIN (HGB A1C): HEMOGLOBIN A1C: 6.9

## 2016-03-23 NOTE — Patient Instructions (Signed)
Call [facility] pharmacy in [place] to check on the price of medication.

## 2016-03-23 NOTE — Progress Notes (Signed)
Jason MinkDavid J Abdou  MRN: [id number] DOB: 11/27/1960  Subjective:  HPI  Patient is here for 4 months follow up He has been checking his sugar and readings have been around 137 but sometimes over 200 when he eats something he should not have. No hypoglycemic episodes. No numbness or tingling sensation. He does not check his b/p. No cardiac symptoms He did see dermatologist for skin lesions on his face and they did find out it was skin cancer and has to go back to get more work done on thiis. His ED is slowly getting worse. He occasionally has failure despite taking Viagra. We discussed other options today. Lab Results  Component Value Date   HGBA1C 7.4 11/25/2015   BP Readings from Last 3 Encounters:  03/23/16 (!) 142/80  11/25/15 120/66  08/10/15 130/78   Patient Active Problem List   Diagnosis Date Noted  . Anxiety 10/08/2014  . 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 HLD (hyperlipidemia) 10/08/2014  . Diabetes mellitus, type 2 (HCC) 10/08/2014    Past Medical History:  Diagnosis Date  . Hyperlipidemia     Social History   Social History  . Marital status: Married    Spouse name: N/A  . Number of children: N/A  . Years of education: N/A   Occupational History  . Not on file.   Social History Main Topics  . Smoking status: Never Smoker  . Smokeless tobacco: Never Used  . Alcohol use No  . Drug use: No  . Sexual activity: Yes   Other Topics Concern  . Not on file   Social History Narrative  . No narrative on file    Outpatient Encounter Prescriptions as of 03/23/2016  Medication Sig Note  . aspirin 81 MG tablet Take 81 mg by mouth daily.   Marland Kitchen. atorvastatin (LIPITOR) 40 MG tablet Take 1 tablet (40 mg total) by mouth at bedtime.   Marland Kitchen. losartan (COZAAR) 50 MG tablet Take 1 tablet by mouth  daily   . metFORMIN (GLUCOPHAGE) 500 MG tablet Take 1 tablet (500 mg total) by mouth daily.   . Misc Natural Products (DAILY HERBS PROSTATE PO) Take by mouth.  08[facility]10/2015: [facility]  . Omega-3 Fatty Acids (FISH OIL) 1000 MG CPDR Take by mouth. 10/08/2014: Received from: [facility]  . ONE TOUCH ULTRA TEST test strip TEST SUGAR ONCE DAILY   . Potassium Gluconate 550 MG TABS Take by mouth. 10/08/2014: Received from: [facility]  . sildenafil (VIAGRA) 50 MG tablet Take 1 tablet (50 mg total) by mouth as needed for erectile dysfunction.   . [DISCONTINUED] polyethylene glycol powder (GLYCOLAX/MIRALAX) powder  04/22/2015: Received from: External Pharmacy  . [DISCONTINUED] valACYclovir (VALTREX) 1000 MG tablet Take 1 tablet (1,000 mg total) by mouth 3 (three) times daily.    No facility-administered encounter medications on file as of 03/23/2016.     Allergies  Allergen Reactions  . Pravastatin Sodium     Other reaction(s): Muscle Cramps    Review of Systems  Constitutional: Positive for malaise/fatigue.  Eyes: Negative.   Respiratory: Positive for cough (x 2 weeks).   Cardiovascular: Negative.   Gastrointestinal: Negative.   Genitourinary:       Decreased sex drive and length of erection  Musculoskeletal: Negative.   Skin: Negative.   Neurological: Negative.   Endo/Heme/Allergies: Negative.   Psychiatric/Behavioral: Negative.     Objective:  BP (!) 142/80   Pulse 60   Temp 98.4  F (36.9 C)   Resp 12   Wt 175 lb (79.4 kg)   BMI 28.03 kg/m   Physical Exam  Constitutional: He is oriented to person, place, and time and well-developed, well-nourished, and in no distress.  HENT:  Head: Normocephalic and atraumatic.  Eyes: Conjunctivae are normal. Pupils are equal, round, and reactive to light.  Neck: Normal range of motion. Neck supple.  Cardiovascular: Normal rate, regular rhythm, normal heart sounds and intact distal pulses.   No murmur heard. Pulmonary/Chest: Effort normal and breath sounds normal. No respiratory distress. He has no wheezes.  Abdominal: Soft.    Musculoskeletal: He exhibits no edema or tenderness.  Neurological: He is alert and oriented to person, place, and time.  Skin: Skin is warm and dry.  Psychiatric: Mood, memory, affect and judgment normal.    Assessment and Plan :  1. Type 2 diabetes mellitus without complication, without long-term current use of insulin (HCC) A1C 6.9 today, better. Continue working on habits. Continue same medication. Patient wishes to try to control this without progressing further with medications, especially insulin. He wants to work hard on diet and exercise. - POCT HgB A1C  2. Other hyperlipidemia  3. ED (erectile dysfunction) of organic origin Discussed with patient that insurance does not cover medication for this. Advised patient that he needs to try 2 tablets at a time of Viagra, he has only been taking 1 tablet when needed. May try to switch to another alternative medication. Sample of Cialis provided and advised patient to take this separate time from Viagra. 4. Cough I think patient should wait for about 2 weeks to get better and then get flu shot. Exam is normal today. Push fluids and rest and follow as needed. 5. Fatigue Consider checking testosterone level in addition to other labs. HPI, Exam and A&P transcribed under direction and in the presence of Julieanne Mansonichard Gilbert, MD.

## 2016-03-30 ENCOUNTER — Ambulatory Visit: Payer: 59 | Admitting: Family Medicine

## 2016-07-17 ENCOUNTER — Other Ambulatory Visit: Payer: Self-pay

## 2016-07-17 MED ORDER — ATORVASTATIN CALCIUM 40 MG PO TABS: 40.0000 mg | ORAL_TABLET | Freq: Every day | ORAL | 2 refills | Status: DC

## 2016-07-19 ENCOUNTER — Other Ambulatory Visit: Payer: Self-pay

## 2016-07-19 MED ORDER — ATORVASTATIN CALCIUM 40 MG PO TABS: 40.0000 mg | ORAL_TABLET | Freq: Every day | ORAL | 0 refills | Status: DC

## 2016-07-26 LAB — HM DIABETES EYE EXAM

## 2016-08-08 ENCOUNTER — Ambulatory Visit (INDEPENDENT_AMBULATORY_CARE_PROVIDER_SITE_OTHER): Payer: 59 | Admitting: Family Medicine

## 2016-08-08 VITALS — BP 134/72 | HR 72 | Temp 98.2°F | Resp 16 | Wt 179.0 lb

## 2016-08-08 DIAGNOSIS — E119 Type 2 diabetes mellitus without complications: Secondary | ICD-10-CM

## 2016-08-08 DIAGNOSIS — E784 Other hyperlipidemia: Secondary | ICD-10-CM | POA: Diagnosis not present

## 2016-08-08 DIAGNOSIS — Z1159 Encounter for screening for other viral diseases: Secondary | ICD-10-CM | POA: Diagnosis not present

## 2016-08-08 DIAGNOSIS — N529 Male erectile dysfunction, unspecified: Secondary | ICD-10-CM | POA: Diagnosis not present

## 2016-08-08 DIAGNOSIS — Z23 Encounter for immunization: Secondary | ICD-10-CM

## 2016-08-08 DIAGNOSIS — E7849 Other hyperlipidemia: Secondary | ICD-10-CM

## 2016-08-08 LAB — POCT GLYCOSYLATED HEMOGLOBIN (HGB A1C): Hemoglobin A1C: 7.9

## 2016-08-08 LAB — POCT UA - MICROALBUMIN: Microalbumin Ur, POC: 20 mg/L

## 2016-08-08 MED ORDER — METFORMIN HCL 500 MG PO TABS: 500.0000 mg | ORAL_TABLET | Freq: Every day | ORAL | 3 refills | Status: DC

## 2016-08-08 MED ORDER — ATORVASTATIN CALCIUM 40 MG PO TABS: 40.0000 mg | ORAL_TABLET | Freq: Every day | ORAL | 3 refills | Status: DC

## 2016-08-08 MED ORDER — LOSARTAN POTASSIUM 50 MG PO TABS: 50.0000 mg | ORAL_TABLET | Freq: Every day | ORAL | 3 refills | Status: DC

## 2016-08-08 NOTE — Progress Notes (Signed)
Jason Glenn  MRN: [id number] DOB: 1961-04-05  Subjective:  HPI  Patient is here for 4 months follow up. LOV was 03/23/16. Routine labs were done on 08/10/15. Patient has been checking his sugars and readings have been higher around 160s fasting. He has been eating more sweets and has had stress. Lab Results  Component Value Date   HGBA1C 6.9 03/23/2016    Patient Active Problem List   Diagnosis Date Noted  . Anxiety 10/08/2014  . 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 HLD (hyperlipidemia) 10/08/2014  . Diabetes mellitus, type 2 (HCC) 10/08/2014    Past Medical History:  Diagnosis Date  . Hyperlipidemia     Social History   Social History  . Marital status: Married    Spouse name: N/A  . Number of children: N/A  . Years of education: N/A   Occupational History  . Not on file.   Social History Main Topics  . Smoking status: Never Smoker  . Smokeless tobacco: Never Used  . Alcohol use No  . Drug use: No  . Sexual activity: Yes   Other Topics Concern  . Not on file   Social History Narrative  . No narrative on file    Outpatient Encounter Prescriptions as of 08/08/2016  Medication Sig Note  . aspirin 81 MG tablet Take 81 mg by mouth daily.   Marland Kitchen atorvastatin (LIPITOR) 40 MG tablet Take 1 tablet (40 mg total) by mouth at bedtime.   Marland Kitchen losartan (COZAAR) 50 MG tablet Take 1 tablet by mouth  daily   . metFORMIN (GLUCOPHAGE) 500 MG tablet Take 1 tablet (500 mg total) by mouth daily.   . Misc Natural Products (DAILY HERBS PROSTATE PO) Take by mouth. 08/10/2015: Received from: [facility]  . Omega-3 Fatty Acids (FISH OIL) 1000 MG CPDR Take by mouth. 10/08/2014: Received from: [facility]  . ONE TOUCH ULTRA TEST test strip TEST SUGAR ONCE DAILY   . Potassium Gluconate 550 MG TABS Take by mouth. 10/08/2014: Received from: [facility]  . sildenafil (VIAGRA) 50 MG tablet Take 1 tablet (50 mg total) by  mouth as needed for erectile dysfunction.    No facility-administered encounter medications on file as of 08/08/2016.     Allergies  Allergen Reactions  . Pravastatin Sodium     Other reaction(s): Muscle Cramps    Review of Systems  Constitutional: Positive for malaise/fatigue (at the end of the day).  Respiratory: Negative.   Cardiovascular: Negative.   Gastrointestinal: Negative.   Musculoskeletal: Positive for myalgias. Negative for back pain, joint pain and neck pain.  Neurological: Negative.   Endo/Heme/Allergies: Negative.   Psychiatric/Behavioral: Negative.     Objective:  BP 134/72   Pulse 72   Temp 98.2 F (36.8 C)   Resp 16   Wt 179 lb (81.2 kg)   BMI 28.67 kg/m   Physical Exam  Constitutional: He is oriented to person, place, and time and well-developed, well-nourished, and in no distress.  HENT:  Head: Normocephalic and atraumatic.  Right Ear: External ear normal.  Left Ear: External ear normal.  Nose: Nose normal.  Eyes: Conjunctivae are normal. Pupils are equal, round, and reactive to light.  Neck: Normal range of motion. Neck supple.  Cardiovascular: Normal rate, regular rhythm, normal heart sounds and intact distal pulses.   No murmur heard. Pulmonary/Chest: Effort normal and breath sounds normal. No respiratory distress. He has no wheezes.  Neurological: He is alert and  oriented to person, place, and time. Gait normal. GCS score is 15.  Skin: Skin is warm and dry.  Psychiatric: Mood, memory, affect and judgment normal.    Assessment and Plan :  1. Type 2 diabetes mellitus without complication, without long-term current use of insulin (HCC) A1C 7.9 worse. Increase metformin to 500 mg twice daily and re check in 3 months. - CBC w/Diff/Platelet - TSH - POCT HgB A1C - POCT UA - Microalbumin - metFORMIN (GLUCOPHAGE) 500 MG tablet; Take 1 tablet (500 mg total) by mouth daily.  Dispense: 180 tablet; Refill: 3  2. Other hyperlipidemia Check labs. -  Lipid Panel With LDL/HDL Ratio - Comprehensive metabolic panel  3. ED (erectile dysfunction) of organic origin - TSH  4. Need for pneumococcal vaccine Pneumovax 23 administered today.  5. Need for hepatitis B vaccination Hepatitis B administered today.  6. Need for hepatitis C screening test - Hepatitis C Antibody  HPI, Exam and A&P transcribed under direction and in the presence of Julieanne Mansonichard Gilbert, MD. I have done the exam and reviewed the chart and it is accurate to the best of my knowledge. DentistDragon  technology has been used and  any errors in dictation or transcription are unintentional. Julieanne Mansonichard Gilbert M.D. [facility]

## 2016-09-05 LAB — CBC WITH DIFFERENTIAL/PLATELET
BASOS ABS: 0 10*3/uL (ref 0.0–0.2)
Basos: 1 %
EOS (ABSOLUTE): 0.2 10*3/uL (ref 0.0–0.4)
EOS: 3 %
HEMOGLOBIN: 14.4 g/dL (ref 13.0–17.7)
Hematocrit: 41.4 % (ref 37.5–51.0)
IMMATURE GRANS (ABS): 0 10*3/uL (ref 0.0–0.1)
IMMATURE GRANULOCYTES: 0 %
Lymphocytes Absolute: 2 10*3/uL (ref 0.7–3.1)
Lymphs: 32 %
MCH: 30.7 pg (ref 26.6–33.0)
MCHC: 34.8 g/dL (ref 31.5–35.7)
MCV: 88 fL (ref 79–97)
MONOCYTES: 5 %
Monocytes Absolute: 0.3 10*3/uL (ref 0.1–0.9)
NEUTROS ABS: 3.8 10*3/uL (ref 1.4–7.0)
Neutrophils: 59 %
Platelets: 226 10*3/uL (ref 150–379)
RBC: 4.69 x10E6/uL (ref 4.14–5.80)
RDW: 13.3 % (ref 12.3–15.4)
WBC: 6.4 10*3/uL (ref 3.4–10.8)

## 2016-09-05 LAB — HEPATITIS C ANTIBODY

## 2016-09-05 LAB — COMPREHENSIVE METABOLIC PANEL
A/G RATIO: 1.6 (ref 1.2–2.2)
ALBUMIN: 4.5 g/dL (ref 3.5–5.5)
ALK PHOS: 69 IU/L (ref 39–117)
ALT: 35 IU/L (ref 0–44)
AST: 19 IU/L (ref 0–40)
BILIRUBIN TOTAL: 0.6 mg/dL (ref 0.0–1.2)
BUN / CREAT RATIO: 16 (ref 9–20)
BUN: 16 mg/dL (ref 6–24)
CHLORIDE: 101 mmol/L (ref 96–106)
CO2: 27 mmol/L (ref 18–29)
Calcium: 9.3 mg/dL (ref 8.7–10.2)
Creatinine, Ser: 0.97 mg/dL (ref 0.76–1.27)
GFR calc non Af Amer: 86 mL/min/{1.73_m2} (ref >59)
GFR, EST AFRICAN AMERICAN: 99 mL/min/{1.73_m2} (ref >59)
GLOBULIN, TOTAL: 2.9 g/dL (ref 1.5–4.5)
Glucose: 172 mg/dL — ABNORMAL HIGH (ref 65–99)
POTASSIUM: 4.9 mmol/L (ref 3.5–5.2)
SODIUM: 142 mmol/L (ref 134–144)
Total Protein: 7.4 g/dL (ref 6.0–8.5)

## 2016-09-05 LAB — LIPID PANEL WITH LDL/HDL RATIO
Cholesterol, Total: 131 mg/dL (ref 100–199)
HDL: 54 mg/dL (ref >39)
LDL Calculated: 60 mg/dL (ref 0–99)
LDl/HDL Ratio: 1.1 ratio (ref 0.0–3.6)
TRIGLYCERIDES: 83 mg/dL (ref 0–149)
VLDL CHOLESTEROL CAL: 17 mg/dL (ref 5–40)

## 2016-09-05 LAB — TSH: TSH: 2.8 u[IU]/mL (ref 0.450–4.500)

## 2016-09-28 ENCOUNTER — Other Ambulatory Visit: Payer: Self-pay | Admitting: Family Medicine

## 2016-09-28 DIAGNOSIS — E119 Type 2 diabetes mellitus without complications: Secondary | ICD-10-CM

## 2016-09-28 MED ORDER — METFORMIN HCL 500 MG PO TABS: 500.0000 mg | ORAL_TABLET | Freq: Two times a day (BID) | ORAL | 3 refills | Status: DC

## 2016-09-28 NOTE — Telephone Encounter (Signed)
[facility] faxed a request on the following medication. Thanks CC  metFORMIN (GLUCOPHAGE) 500 MG tablet

## 2016-09-28 NOTE — Telephone Encounter (Signed)
Was increased to BID at LOV. Resent in. Allene DillonEmily Drozdowski, CMA

## 2016-10-02 ENCOUNTER — Ambulatory Visit (INDEPENDENT_AMBULATORY_CARE_PROVIDER_SITE_OTHER): Payer: 59 | Admitting: Family Medicine

## 2016-10-02 VITALS — BP 134/72 | HR 64 | Temp 98.1°F | Resp 14 | Wt 177.0 lb

## 2016-10-02 DIAGNOSIS — N529 Male erectile dysfunction, unspecified: Secondary | ICD-10-CM | POA: Diagnosis not present

## 2016-10-02 DIAGNOSIS — Z125 Encounter for screening for malignant neoplasm of prostate: Secondary | ICD-10-CM | POA: Diagnosis not present

## 2016-10-02 DIAGNOSIS — E119 Type 2 diabetes mellitus without complications: Secondary | ICD-10-CM | POA: Diagnosis not present

## 2016-10-02 DIAGNOSIS — R361 Hematospermia: Secondary | ICD-10-CM | POA: Diagnosis not present

## 2016-10-02 LAB — POCT URINALYSIS DIPSTICK
Bilirubin, UA: NEGATIVE
Blood, UA: NEGATIVE
GLUCOSE UA: NEGATIVE
Ketones, UA: NEGATIVE
LEUKOCYTES UA: NEGATIVE
Nitrite, UA: NEGATIVE
PROTEIN UA: NEGATIVE
SPEC GRAV UA: 1.01 (ref 1.010–1.025)
UROBILINOGEN UA: 0.2 U/dL
pH, UA: 6 (ref 5.0–8.0)

## 2016-10-02 LAB — IFOBT (OCCULT BLOOD): IMMUNOLOGICAL FECAL OCCULT BLOOD TEST: NEGATIVE

## 2016-10-02 NOTE — Progress Notes (Signed)
Jason MinkDavid J Glenn  MRN: [id number] DOB: 10/19/1960  Subjective:  HPI   Patient is here to discuss blood with ejaculation.  He noticed this about 3 weeks ago around 09/04/16 during sexual intercourse that with ejaculation there was some blood. This happened 3 times a few days apart from each encounter but with less blood each time. Has not noticed this since then. He did have slight pain with ejaculation when he noticed some blood. No other symptoms then and no symptoms of concern present now. Patient wanted to discuss this to make sure. No medication changes at that time the only recent medication change was done on last visit 08/09/16 increasing Metformin to 1 BID.  Patient Active Problem List   Diagnosis Date Noted  . Anxiety 10/08/2014  . 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 HLD (hyperlipidemia) 10/08/2014  . Diabetes mellitus, type 2 (HCC) 10/08/2014    Past Medical History:  Diagnosis Date  . Hyperlipidemia     Social History   Social History  . Marital status: Married    Spouse name: N/A  . Number of children: N/A  . Years of education: N/A   Occupational History  . Not on file.   Social History Main Topics  . Smoking status: Never Smoker  . Smokeless tobacco: Never Used  . Alcohol use No  . Drug use: No  . Sexual activity: Yes   Other Topics Concern  . Not on file   Social History Narrative  . No narrative on file    Outpatient Encounter Prescriptions as of 10/02/2016  Medication Sig Note  . aspirin 81 MG tablet Take 81 mg by mouth daily.   Marland Kitchen. atorvastatin (LIPITOR) 40 MG tablet Take 1 tablet (40 mg total) by mouth at bedtime.   Marland Kitchen. losartan (COZAAR) 50 MG tablet Take 1 tablet (50 mg total) by mouth daily.   . metFORMIN (GLUCOPHAGE) 500 MG tablet Take 1 tablet (500 mg total) by mouth 2 (two) times daily with a meal.   . Misc Natural Products (DAILY HERBS PROSTATE PO) Take by mouth. 08/10/2015: [facility]  .  Omega-3 Fatty Acids (FISH OIL) 1000 MG CPDR Take by mouth. 10/08/2014: Received from: [facility]  . ONE TOUCH ULTRA TEST test strip TEST SUGAR ONCE DAILY   . Potassium Gluconate 550 MG TABS Take by mouth. 10/08/2014: Received from: [facility]  . sildenafil (VIAGRA) 50 MG tablet Take 1 tablet (50 mg total) by mouth as needed for erectile dysfunction.    No facility-administered encounter medications on file as of 10/02/2016.     Allergies  Allergen Reactions  . Pravastatin Sodium     Other reaction(s): Muscle Cramps    Review of Systems  Constitutional: Negative.   Respiratory: Negative.   Cardiovascular: Negative.   Gastrointestinal: Negative.   Genitourinary: Negative.   Musculoskeletal: Negative.     Objective:  BP 134/72   Pulse 64   Temp 98.1 F (36.7 C)   Resp 14   Wt 177 lb (80.3 kg)   BMI 28.35 kg/m   Physical Exam  Constitutional: He is oriented to person, place, and time and well-developed, well-nourished, and in no distress.  HENT:  Head: Normocephalic and atraumatic.  Eyes: Conjunctivae are normal. Pupils are equal, round, and reactive to light.  Neck: Normal range of motion. Neck supple.  Cardiovascular: Normal rate, regular rhythm, normal heart sounds and intact distal pulses.  Exam reveals no gallop.  No murmur heard. Pulmonary/Chest: Effort normal and breath sounds normal. No respiratory distress. He has no wheezes.  Genitourinary: Rectum normal, prostate normal and penis normal. Rectal exam shows guaiac negative stool. No discharge found.  Musculoskeletal: He exhibits no edema or tenderness.  Neurological: He is alert and oriented to person, place, and time.    Assessment and Plan :  1. Bloody ejaculation Resolved at this time. UA is normal.Urology referral if persistant or if he develops hematuria. - POCT Urinalysis Dipstick  2. ED (erectile dysfunction) of organic origin  3. Type 2 diabetes mellitus without  complication, without long-term current use of insulin (HCC)  4. Prostate cancer screening Check PSA,  - PSA - IFOBT POC (occult bld, rslt in office); Future-negative.   HPI, Exam and A&P transcribed by Samara Deist, RMA under direction and in the presence of Julieanne Manson, MD. I have done the exam and reviewed the above chart and it is accurate to the best of my knowledge. Dentist has been used in this note in any air is in the dictation or transcription are unintentional.

## 2016-10-03 LAB — PSA: Prostate Specific Ag, Serum: 0.5 ng/mL (ref 0.0–4.0)

## 2016-11-09 ENCOUNTER — Ambulatory Visit: Payer: 59 | Admitting: Family Medicine

## 2016-12-07 ENCOUNTER — Ambulatory Visit: Payer: 59 | Admitting: Family Medicine

## 2016-12-13 ENCOUNTER — Encounter: Payer: Self-pay | Admitting: Family Medicine

## 2016-12-13 ENCOUNTER — Ambulatory Visit (INDEPENDENT_AMBULATORY_CARE_PROVIDER_SITE_OTHER): Payer: 59 | Admitting: Family Medicine

## 2016-12-13 VITALS — BP 126/68 | HR 68 | Temp 97.8°F | Resp 16 | Wt 176.0 lb

## 2016-12-13 DIAGNOSIS — E7800 Pure hypercholesterolemia, unspecified: Secondary | ICD-10-CM | POA: Diagnosis not present

## 2016-12-13 DIAGNOSIS — E119 Type 2 diabetes mellitus without complications: Secondary | ICD-10-CM | POA: Diagnosis not present

## 2016-12-13 LAB — POCT GLYCOSYLATED HEMOGLOBIN (HGB A1C): HEMOGLOBIN A1C: 7.4

## 2016-12-13 NOTE — Progress Notes (Signed)
Patient: Jason Glenn Male    DOB: 02/06/1961   56 y.o.   MRN: [id number] Visit Date: 12/13/2016  Today's Provider: Megan Mansichard Gilbert Jr, MD   Chief Complaint  Patient presents with  . Diabetes  . Hyperlipidemia   Subjective:    HPI   Diabetes Mellitus Type II, Follow-up:   Lab Results  Component Value Date   HGBA1C 7.9 08/08/2016   HGBA1C 6.9 03/23/2016   HGBA1C 7.4 11/25/2015   Last seen for diabetes 4 months ago.  Management since then includes Increased Metformin to 500mg  twice a day. He reports excellent compliance with treatment. He is not having side effects.  Current symptoms include none and have been stable. Home blood sugar records: Low to mid 100's.  Episodes of hypoglycemia? no Most Recent Eye Exam: UTD Weight trend: stable Current diet: in general, a "healthy" diet   Current exercise: none    Lipid/Cholesterol, Follow-up:   Last seen for this 4 months ago.  Management since that visit includes None.  Last Lipid Panel:    Component Value Date/Time   CHOL 131 09/04/2016 0810   TRIG 83 09/04/2016 0810   HDL 54 09/04/2016 0810   LDLCALC 60 09/04/2016 0810    He reports excellent compliance with treatment. He is not having side effects.   Wt Readings from Last 3 Encounters:  12/13/16 176 lb (79.8 kg)  10/02/16 177 lb (80.3 kg)  08/08/16 179 lb (81.2 kg)    ------------------------------------------------------------------------      Allergies  Allergen Reactions  . Pravastatin Sodium     Other reaction(s): Muscle Cramps     Current Outpatient Prescriptions:  .  aspirin 81 MG tablet, Take 81 mg by mouth daily., Disp: , Rfl:  .  atorvastatin (LIPITOR) 40 MG tablet, Take 1 tablet (40 mg total) by mouth at bedtime., Disp: 90 tablet, Rfl: 3 .  losartan (COZAAR) 50 MG tablet, Take 1 tablet (50 mg total) by mouth daily., Disp: 90 tablet, Rfl: 3 .  metFORMIN (GLUCOPHAGE) 500 MG tablet, Take 1 tablet (500 mg total) by mouth 2 (two)  times daily with a meal., Disp: 180 tablet, Rfl: 3 .  Misc Natural Products (DAILY HERBS PROSTATE PO), Take by mouth., Disp: , Rfl:  .  Omega-3 Fatty Acids (FISH OIL) 1000 MG CPDR, Take by mouth., Disp: , Rfl:  .  ONE TOUCH ULTRA TEST test strip, TEST SUGAR ONCE DAILY, Disp: 100 each, Rfl: 12 .  Potassium Gluconate 550 MG TABS, Take by mouth., Disp: , Rfl:  .  sildenafil (VIAGRA) 50 MG tablet, Take 1 tablet (50 mg total) by mouth as needed for erectile dysfunction., Disp: 10 tablet, Rfl: 12  Review of Systems  Constitutional: Negative.   Respiratory: Negative.  Negative for apnea and chest tightness.   Cardiovascular: Negative for chest pain, palpitations and leg swelling.  Gastrointestinal: Negative.   Endocrine: Negative.   Musculoskeletal: Negative.   Neurological: Negative for dizziness, light-headedness and headaches.    Social History  Substance Use Topics  . Smoking status: Never Smoker  . Smokeless tobacco: Never Used  . Alcohol use No   Objective:   BP 126/68 (BP Location: Right Arm, Patient Position: Sitting, Cuff Size: Normal)   Pulse 68   Temp 97.8 F (36.6 C) (Oral)   Resp 16   Wt 176 lb (79.8 kg)   BMI 28.19 kg/m  Vitals:   12/13/16 1603  BP: 126/68  Pulse: 68  Resp: 16  Temp: 97.8 F (36.6 C)  TempSrc: Oral  Weight: 176 lb (79.8 kg)   Results for orders placed or performed in visit on 12/13/16  POCT glycosylated hemoglobin (Hb A1C)  Result Value Ref Range   Hemoglobin A1C 7.4       Physical Exam  Constitutional: He is oriented to person, place, and time. He appears well-developed and well-nourished.  HENT:  Head: Normocephalic and atraumatic.  Cardiovascular: Normal rate, regular rhythm and normal heart sounds.   Pulmonary/Chest: Effort normal and breath sounds normal.  Abdominal: Soft. Bowel sounds are normal.  Neurological: He is alert and oriented to person, place, and time. He has normal reflexes.  Skin: Skin is warm and dry.  Psychiatric:  He has a normal mood and affect. His behavior is normal. Judgment and thought content normal.        Assessment & Plan:     1. Type 2 diabetes mellitus without complication, without long-term current use of insulin (HCC) A1C slightly improved from 7.9 to 7.4 today.     - POCT glycosylated hemoglobin (Hb A1C)  2. Pure hypercholesterolemia  Stable recheck labs in four months.        I have done the exam and reviewed the above chart and it is accurate to the best of my knowledge. [employer]  technology has been used in this note in any air is in the dictation or transcription are unintentional.  Megan Mansichard Gilbert Jr, MD  [facility]

## 2017-03-22 ENCOUNTER — Telehealth: Payer: Self-pay

## 2017-03-22 MED ORDER — SILDENAFIL CITRATE 50 MG PO TABS: 50.0000 mg | ORAL_TABLET | ORAL | 5 refills | Status: DC | PRN

## 2017-03-22 NOTE — Telephone Encounter (Signed)
Pt requesting Viagra refill. Pt advised per dr Sullivan Lonegilbert ok to refill.

## 2017-03-22 NOTE — Telephone Encounter (Signed)
Patient called wanting one of Dr. Elisabeth CaraGilbert's nurses to give him a call back. He would not discuss what it was about. Contact number is correct. Thanks!

## 2017-04-24 ENCOUNTER — Ambulatory Visit: Payer: 59 | Admitting: Family Medicine

## 2017-04-24 VITALS — BP 122/82 | HR 60 | Temp 97.6°F | Resp 16 | Ht 65.0 in | Wt 177.0 lb

## 2017-04-24 DIAGNOSIS — R0683 Snoring: Secondary | ICD-10-CM | POA: Diagnosis not present

## 2017-04-24 DIAGNOSIS — E119 Type 2 diabetes mellitus without complications: Secondary | ICD-10-CM

## 2017-04-24 DIAGNOSIS — E7800 Pure hypercholesterolemia, unspecified: Secondary | ICD-10-CM

## 2017-04-24 DIAGNOSIS — F419 Anxiety disorder, unspecified: Secondary | ICD-10-CM | POA: Diagnosis not present

## 2017-04-24 NOTE — Progress Notes (Signed)
Patient: Jason MinkDavid J Charley Male    DOB: 10/09/1960   56 y.o.   MRN: [id number] Visit Date: 04/24/2017  Today's Provider: Megan Mansichard Chief Walkup Jr, MD   Chief Complaint  Patient presents with  . Diabetes  . Hyperlipidemia   Subjective:    HPI  Diabetes Mellitus Type II, Follow-up:   Lab Results  Component Value Date   HGBA1C 7.4 12/13/2016   HGBA1C 7.9 08/08/2016   HGBA1C 6.9 03/23/2016    Last seen for diabetes 4 months ago.  Management since then includes none. He reports good compliance with treatment. He is not having side effects.  Home blood sugar records: 70-114  Episodes of hypoglycemia? no   Current Insulin Regimen: n/a Most Recent Eye Exam: 07/26/2017 Current exercise: none  Pertinent Labs:    Component Value Date/Time   CHOL 131 09/04/2016 0810   TRIG 83 09/04/2016 0810   HDL 54 09/04/2016 0810   LDLCALC 60 09/04/2016 0810   CREATININE 0.97 09/04/2016 0810    Wt Readings from Last 3 Encounters:  04/24/17 177 lb (80.3 kg)  12/13/16 176 lb (79.8 kg)  10/02/16 177 lb (80.3 kg)    ------------------------------------------------------------------------  Lipid/Cholesterol, Follow-up:   Last seen for this 4 months ago.  Management changes since that visit include none, noted to recheck next OV. . Last Lipid Panel:    Component Value Date/Time   CHOL 131 09/04/2016 0810   TRIG 83 09/04/2016 0810   HDL 54 09/04/2016 0810   LDLCALC 60 09/04/2016 0810    Wt Readings from Last 3 Encounters:  04/24/17 177 lb (80.3 kg)  12/13/16 176 lb (79.8 kg)  10/02/16 177 lb (80.3 kg)    -------------------------------------------------------------------      Allergies  Allergen Reactions  . Pravastatin Sodium     Other reaction(s): Muscle Cramps     Current Outpatient Medications:  .  aspirin 81 MG tablet, Take 81 mg by mouth daily., Disp: , Rfl:  .  atorvastatin (LIPITOR) 40 MG tablet, Take 1 tablet (40 mg total) by mouth at bedtime., Disp: 90  tablet, Rfl: 3 .  losartan (COZAAR) 50 MG tablet, Take 1 tablet (50 mg total) by mouth daily., Disp: 90 tablet, Rfl: 3 .  metFORMIN (GLUCOPHAGE) 500 MG tablet, Take 1 tablet (500 mg total) by mouth 2 (two) times daily with a meal., Disp: 180 tablet, Rfl: 3 .  Misc Natural Products (DAILY HERBS PROSTATE PO), Take by mouth., Disp: , Rfl:  .  Omega-3 Fatty Acids (FISH OIL) 1000 MG CPDR, Take by mouth., Disp: , Rfl:  .  ONE TOUCH ULTRA TEST test strip, TEST SUGAR ONCE DAILY, Disp: 100 each, Rfl: 12 .  Potassium Gluconate 550 MG TABS, Take by mouth., Disp: , Rfl:  .  sildenafil (VIAGRA) 50 MG tablet, Take 1 tablet (50 mg total) as needed by mouth for erectile dysfunction., Disp: 10 tablet, Rfl: 5  Review of Systems  Constitutional: Positive for fatigue.  HENT: Negative.   Eyes: Negative.   Respiratory: Negative.   Cardiovascular: Negative.   Gastrointestinal: Negative.   Endocrine: Negative.   Genitourinary: Negative.   Musculoskeletal: Negative.   Skin: Negative.   Allergic/Immunologic: Negative.   Neurological: Negative.   Hematological: Negative.   Psychiatric/Behavioral: Negative.     Social History   Tobacco Use  . Smoking status: Never Smoker  . Smokeless tobacco: Never Used  Substance Use Topics  . Alcohol use: No   Objective:   BP 122/82 (BP  Location: Left Arm, Patient Position: Sitting, Cuff Size: Normal)   Pulse 60   Temp 97.6 F (36.4 C) (Oral)   Resp 16   Ht 5\' 5"  (1.651 m)   Wt 177 lb (80.3 kg)   BMI 29.45 kg/m  Vitals:   04/24/17 1604  BP: 122/82  Pulse: 60  Resp: 16  Temp: 97.6 F (36.4 C)  TempSrc: Oral  Weight: 177 lb (80.3 kg)  Height: 5\' 5"  (1.651 m)     Physical Exam  Constitutional: He is oriented to person, place, and time. He appears well-developed and well-nourished.  Eyes: Conjunctivae and EOM are normal. Pupils are equal, round, and reactive to light.  Neck: Normal range of motion. Neck supple.  Cardiovascular: Normal rate, regular  rhythm, normal heart sounds and intact distal pulses.  Pulmonary/Chest: Effort normal and breath sounds normal.  Musculoskeletal: Normal range of motion.  Neurological: He is alert and oriented to person, place, and time. He has normal reflexes.  Skin: Skin is warm and dry.  Psychiatric: He has a normal mood and affect. His behavior is normal. Judgment and thought content normal.        Assessment & Plan:     1. Type 2 diabetes mellitus without complication, without long-term current use of insulin (HCC)  - CBC with Differential/Platelet - Hemoglobin A1c  2. Pure hypercholesterolemia  - Lipid panel - Comprehensive metabolic panel  3. Anxiety Stable. - TSH  4. Snoring/Fatigue Pt will ask wife if he stops breathing at night [place] 8 today. May need sleep study.      HPI, Exam, and A&P Transcribed under the direction and in the presence of Marializ Ferrebee L. Wendelyn BreslowGilbert Jr, MD  Electronically Signed: Silvio PateBrittany O'Dell, CMA  I have done the exam and reviewed the above chart and it is accurate to the best of my knowledge. [employer]  technology has been used in this note in any air is in the dictation or transcription are unintentional.  Megan Mansichard Da Authement Jr, MD  [facility]

## 2017-05-13 LAB — COMPREHENSIVE METABOLIC PANEL
A/G RATIO: 1.7 (ref 1.2–2.2)
ALK PHOS: 63 IU/L (ref 39–117)
ALT: 40 IU/L (ref 0–44)
AST: 23 IU/L (ref 0–40)
Albumin: 4.7 g/dL (ref 3.5–5.5)
BILIRUBIN TOTAL: 0.8 mg/dL (ref 0.0–1.2)
BUN/Creatinine Ratio: 15 (ref 9–20)
BUN: 14 mg/dL (ref 6–24)
CALCIUM: 9.5 mg/dL (ref 8.7–10.2)
CHLORIDE: 100 mmol/L (ref 96–106)
CO2: 24 mmol/L (ref 20–29)
Creatinine, Ser: 0.92 mg/dL (ref 0.76–1.27)
GFR calc Af Amer: 106 mL/min/{1.73_m2} (ref >59)
GFR calc non Af Amer: 91 mL/min/{1.73_m2} (ref >59)
Globulin, Total: 2.8 g/dL (ref 1.5–4.5)
Glucose: 153 mg/dL — ABNORMAL HIGH (ref 65–99)
POTASSIUM: 4.5 mmol/L (ref 3.5–5.2)
Sodium: 143 mmol/L (ref 134–144)
Total Protein: 7.5 g/dL (ref 6.0–8.5)

## 2017-05-13 LAB — CBC WITH DIFFERENTIAL/PLATELET
BASOS ABS: 0 10*3/uL (ref 0.0–0.2)
BASOS: 0 %
EOS (ABSOLUTE): 0.2 10*3/uL (ref 0.0–0.4)
EOS: 3 %
HEMOGLOBIN: 14.8 g/dL (ref 13.0–17.7)
Hematocrit: 46.1 % (ref 37.5–51.0)
IMMATURE GRANS (ABS): 0 10*3/uL (ref 0.0–0.1)
Immature Granulocytes: 0 %
LYMPHS: 30 %
Lymphocytes Absolute: 2.2 10*3/uL (ref 0.7–3.1)
MCH: 31 pg (ref 26.6–33.0)
MCHC: 32.1 g/dL (ref 31.5–35.7)
MCV: 96 fL (ref 79–97)
MONOCYTES: 6 %
Monocytes Absolute: 0.4 10*3/uL (ref 0.1–0.9)
NEUTROS ABS: 4.3 10*3/uL (ref 1.4–7.0)
Neutrophils: 61 %
Platelets: 276 10*3/uL (ref 150–379)
RBC: 4.78 x10E6/uL (ref 4.14–5.80)
RDW: 13.5 % (ref 12.3–15.4)
WBC: 7.1 10*3/uL (ref 3.4–10.8)

## 2017-05-13 LAB — LIPID PANEL WITH LDL/HDL RATIO
Cholesterol, Total: 165 mg/dL (ref 100–199)
HDL: 51 mg/dL (ref >39)
LDL CALC: 84 mg/dL (ref 0–99)
LDL/HDL RATIO: 1.6 ratio (ref 0.0–3.6)
TRIGLYCERIDES: 149 mg/dL (ref 0–149)
VLDL CHOLESTEROL CAL: 30 mg/dL (ref 5–40)

## 2017-05-13 LAB — HEMOGLOBIN A1C
Est. average glucose Bld gHb Est-mCnc: 171 mg/dL
HEMOGLOBIN A1C: 7.6 % — AB (ref 4.8–5.6)

## 2017-05-13 LAB — TSH: TSH: 3 u[IU]/mL (ref 0.450–4.500)

## 2017-06-21 ENCOUNTER — Other Ambulatory Visit: Payer: Self-pay | Admitting: Family Medicine

## 2017-06-21 DIAGNOSIS — E119 Type 2 diabetes mellitus without complications: Secondary | ICD-10-CM

## 2017-09-24 ENCOUNTER — Ambulatory Visit: Payer: Self-pay | Admitting: Family Medicine

## 2017-10-11 ENCOUNTER — Ambulatory Visit: Payer: 59 | Admitting: Family Medicine

## 2017-10-15 ENCOUNTER — Encounter: Payer: Self-pay | Admitting: Family Medicine

## 2017-10-15 ENCOUNTER — Ambulatory Visit (INDEPENDENT_AMBULATORY_CARE_PROVIDER_SITE_OTHER): Payer: BLUE CROSS/BLUE SHIELD | Admitting: Family Medicine

## 2017-10-15 VITALS — BP 118/70 | HR 62 | Temp 98.3°F | Resp 16 | Wt 175.0 lb

## 2017-10-15 DIAGNOSIS — N529 Male erectile dysfunction, unspecified: Secondary | ICD-10-CM

## 2017-10-15 DIAGNOSIS — E119 Type 2 diabetes mellitus without complications: Secondary | ICD-10-CM | POA: Diagnosis not present

## 2017-10-15 LAB — POCT GLYCOSYLATED HEMOGLOBIN (HGB A1C): Hemoglobin A1C: 7.6 % — AB (ref 4.0–5.6)

## 2017-10-15 MED ORDER — SILDENAFIL CITRATE 50 MG PO TABS: 50.0000 mg | ORAL_TABLET | ORAL | 5 refills | Status: DC | PRN

## 2017-10-15 NOTE — Progress Notes (Signed)
Patient: Jason Glenn Male    DOB: May 29, 1960   57 y.o.   MRN: [id number] Visit Date: 10/15/2017  Today's Provider: [name], MD   Chief Complaint  Patient presents with  . Diabetes  . Hypertension   Subjective:    HPI  Diabetes Mellitus Type II, Follow-up:   Lab Results  Component Value Date   HGBA1C 7.6 (H) 05/11/2017   HGBA1C 7.4 12/13/2016   HGBA1C 7.9 08/08/2016    Last seen for diabetes 6 months ago.  Management since then includes none. He reports good compliance with treatment. He is not having side effects.  Home blood sugar records: 160's-170's  Episodes of hypoglycemia? no   Current Insulin Regimen: n/a Most Recent Eye Exam: has been over a year Current exercise: none formally but is very active.   Pertinent Labs:    Component Value Date/Time   CHOL 165 05/11/2017 0843   TRIG 149 05/11/2017 0843   HDL 51 05/11/2017 0843   LDLCALC 84 05/11/2017 0843   CREATININE 0.92 05/11/2017 0843    Wt Readings from Last 3 Encounters:  10/15/17 175 lb (79.4 kg)  04/24/17 177 lb (80.3 kg)  12/13/16 176 lb (79.8 kg)    ------------------------------------------------------------------------ Pt was told at last OV to ask his wife he snored. He reports that his wife says that he does snore.      Allergies  Allergen Reactions  . Pravastatin Sodium     Other reaction(s): Muscle Cramps     Current Outpatient Medications:  .  aspirin 81 MG tablet, Take 81 mg by mouth daily., Disp: , Rfl:  .  atorvastatin (LIPITOR) 40 MG tablet, TAKE 1 TABLET BY MOUTH AT  BEDTIME, Disp: 90 tablet, Rfl: 3 .  losartan (COZAAR) 50 MG tablet, TAKE 1 TABLET BY MOUTH  DAILY, Disp: 90 tablet, Rfl: 3 .  metFORMIN (GLUCOPHAGE) 500 MG tablet, TAKE 1 TABLET BY MOUTH TWO  TIMES DAILY WITH A MEAL, Disp: 180 tablet, Rfl: 3 .  Misc Natural Products (DAILY HERBS PROSTATE PO), Take by mouth., Disp: , Rfl:  .  Omega-3 Fatty Acids (FISH OIL) 1000 MG CPDR, Take by mouth., Disp:  , Rfl:  .  ONE TOUCH ULTRA TEST test strip, TEST SUGAR ONCE DAILY, Disp: 100 each, Rfl: 12 .  Potassium Gluconate 550 MG TABS, Take by mouth., Disp: , Rfl:  .  sildenafil (VIAGRA) 50 MG tablet, Take 1 tablet (50 mg total) as needed by mouth for erectile dysfunction., Disp: 10 tablet, Rfl: 5  Review of Systems  Constitutional: Positive for fatigue.  HENT: Negative.   Eyes: Negative.   Respiratory: Negative.   Cardiovascular: Negative.   Gastrointestinal: Negative.   Endocrine: Negative.   Genitourinary: Negative.   Musculoskeletal: Negative.   Skin: Negative.   Allergic/Immunologic: Negative.   Neurological: Negative.   Hematological: Negative.   Psychiatric/Behavioral: Negative.     Social History   Tobacco Use  . Smoking status: Never Smoker  . Smokeless tobacco: Never Used  Substance Use Topics  . Alcohol use: No   Objective:   BP 118/70 (BP Location: Left Arm, Patient Position: Sitting, Cuff Size: Normal)   Pulse 62   Temp 98.3 F (36.8 C) (Oral)   Resp 16   Wt 175 lb (79.4 kg)   BMI 29.12 kg/m  Vitals:   10/15/17 1536  BP: 118/70  Pulse: 62  Resp: 16  Temp: 98.3 F (36.8 C)  TempSrc: Oral  Weight: 175  lb (79.4 kg)     Physical Exam  Constitutional: He is oriented to person, place, and time. He appears well-developed and well-nourished.  HENT:  Head: Normocephalic and atraumatic.  Right Ear: External ear normal.  Left Ear: External ear normal.  Nose: Nose normal.  Eyes: Conjunctivae are normal. No scleral icterus.  Neck: No thyromegaly present.  Cardiovascular: Normal rate, regular rhythm and normal heart sounds.  Pulmonary/Chest: Effort normal and breath sounds normal.  Abdominal: Soft.  Musculoskeletal: He exhibits no edema.  Neurological: He is alert and oriented to person, place, and time.  Skin: Skin is warm and dry.  Psychiatric: He has a normal mood and affect. His behavior is normal. Judgment and thought content normal.          Assessment & Plan:     1. Type 2 diabetes mellitus without complication, without long-term current use of insulin (HCC) Lifestyle stressed. RTC 3-4 months. - POCT HgB A1C--7.6  2. ED (erectile dysfunction) of organic origin Helped with Viagra prn. - sildenafil (VIAGRA) 50 MG tablet; Take 1 tablet (50 mg total) by mouth as needed for erectile dysfunction.  Dispense: 6 tablet; Refill: 5 3.HTN 4.HLD      Megan Mansichard Jillisa Harris Jr, MD  [facility]

## 2018-02-11 DIAGNOSIS — D2272 Melanocytic nevi of left lower limb, including hip: Secondary | ICD-10-CM | POA: Diagnosis not present

## 2018-02-11 DIAGNOSIS — D2262 Melanocytic nevi of left upper limb, including shoulder: Secondary | ICD-10-CM | POA: Diagnosis not present

## 2018-02-11 DIAGNOSIS — D2261 Melanocytic nevi of right upper limb, including shoulder: Secondary | ICD-10-CM | POA: Diagnosis not present

## 2018-02-11 DIAGNOSIS — Z1283 Encounter for screening for malignant neoplasm of skin: Secondary | ICD-10-CM | POA: Diagnosis not present

## 2018-02-11 DIAGNOSIS — D1801 Hemangioma of skin and subcutaneous tissue: Secondary | ICD-10-CM | POA: Diagnosis not present

## 2018-02-11 DIAGNOSIS — D485 Neoplasm of uncertain behavior of skin: Secondary | ICD-10-CM | POA: Diagnosis not present

## 2018-02-14 ENCOUNTER — Ambulatory Visit (INDEPENDENT_AMBULATORY_CARE_PROVIDER_SITE_OTHER): Payer: BLUE CROSS/BLUE SHIELD | Admitting: Family Medicine

## 2018-02-14 VITALS — BP 132/84 | HR 75 | Temp 98.0°F | Resp 14 | Wt 179.0 lb

## 2018-02-14 DIAGNOSIS — Z23 Encounter for immunization: Secondary | ICD-10-CM | POA: Diagnosis not present

## 2018-02-14 DIAGNOSIS — H903 Sensorineural hearing loss, bilateral: Secondary | ICD-10-CM

## 2018-02-14 DIAGNOSIS — E119 Type 2 diabetes mellitus without complications: Secondary | ICD-10-CM | POA: Diagnosis not present

## 2018-02-14 DIAGNOSIS — Z0000 Encounter for general adult medical examination without abnormal findings: Secondary | ICD-10-CM | POA: Diagnosis not present

## 2018-02-14 DIAGNOSIS — H9313 Tinnitus, bilateral: Secondary | ICD-10-CM

## 2018-02-14 DIAGNOSIS — Z125 Encounter for screening for malignant neoplasm of prostate: Secondary | ICD-10-CM | POA: Diagnosis not present

## 2018-02-14 LAB — POCT URINALYSIS DIPSTICK
Bilirubin, UA: NEGATIVE
Blood, UA: NEGATIVE
Glucose, UA: POSITIVE — AB
KETONES UA: NEGATIVE
LEUKOCYTES UA: NEGATIVE
NITRITE UA: NEGATIVE
PROTEIN UA: POSITIVE — AB
Spec Grav, UA: 1.025 (ref 1.010–1.025)
Urobilinogen, UA: 0.2 E.U./dL
pH, UA: 5 (ref 5.0–8.0)

## 2018-02-14 LAB — POCT UA - MICROALBUMIN: MICROALBUMIN (UR) POC: 50 mg/L

## 2018-02-14 NOTE — Progress Notes (Signed)
Patient: Jason Glenn, Male    DOB: 1960-12-16, 57 y.o.   MRN: [id number] Visit Date: 02/14/2018  Today's Provider: [name], MD   Chief Complaint  Patient presents with  . Annual Exam   Subjective:  Jason Glenn is a 57 y.o. male who presents today for health maintenance and complete physical. He feels well. He reports exercising none. He reports he is sleeping well. He has been off of Lipitor for 3 months and feels better. 03/23/15 Colonoscopy-tubular adenoma, repeat in 5 years.  Review of Systems  Constitutional: Negative.   HENT: Positive for hearing loss and tinnitus.   Eyes: Negative.   Respiratory: Negative.   Cardiovascular: Negative.   Gastrointestinal: Negative.   Endocrine: Negative.   Genitourinary: Negative.   Musculoskeletal: Positive for myalgias.  Skin: Negative.   Allergic/Immunologic: Negative.   Neurological: Negative.   Hematological: Negative.   Psychiatric/Behavioral: Negative.     Social History   Socioeconomic History  . Marital status: Married    Spouse name: Not on file  . Number of children: Not on file  . Years of education: Not on file  . Highest education level: Not on file  Occupational History  . Not on file  Social Needs  . Financial resource strain: Not on file  . Food insecurity:    Worry: Not on file    Inability: Not on file  . Transportation needs:    Medical: Not on file    Non-medical: Not on file  Tobacco Use  . Smoking status: Never Smoker  . Smokeless tobacco: Never Used  Substance and Sexual Activity  . Alcohol use: No  . Drug use: No  . Sexual activity: Yes  Lifestyle  . Physical activity:    Days per week: Not on file    Minutes per session: Not on file  . Stress: Not on file  Relationships  . Social connections:    Talks on phone: Not on file    Gets together: Not on file    Attends religious service: Not on file    Active member of club or organization: Not on file    Attends meetings of clubs  or organizations: Not on file    Relationship status: Not on file  . Intimate partner violence:    Fear of current or ex partner: Not on file    Emotionally abused: Not on file    Physically abused: Not on file    Forced sexual activity: Not on file  Other Topics Concern  . Not on file  Social History Narrative  . Not on file    Patient Active Problem List   Diagnosis Date Noted  . Anxiety 10/08/2014  . 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 HLD (hyperlipidemia) 10/08/2014  . Diabetes mellitus, type 2 (HCC) 10/08/2014    Past Surgical History:  Procedure Laterality Date  . POLYPECTOMY     tubular adenoma from colon    His family history includes CAD in his father; Cancer in his mother; Leukemia in his father.     Outpatient Encounter Medications as of 02/14/2018  Medication Sig Note  . aspirin 81 MG tablet Take 81 mg by mouth daily.   Marland Kitchen losartan (COZAAR) 50 MG tablet TAKE 1 TABLET BY MOUTH  DAILY   . metFORMIN (GLUCOPHAGE) 500 MG tablet TAKE 1 TABLET BY MOUTH TWO  TIMES DAILY WITH A MEAL   . Misc Natural Products (DAILY HERBS PROSTATE PO) Take by mouth.  08/10/2015: Received from: [facility]  . Omega-3 Fatty Acids (FISH OIL) 1000 MG CPDR Take by mouth. 10/08/2014: Received from: [facility]  . ONE TOUCH ULTRA TEST test strip TEST SUGAR ONCE DAILY   . Potassium Gluconate 550 MG TABS Take by mouth. 10/08/2014: Received from: [facility]  . sildenafil (VIAGRA) 50 MG tablet Take 1 tablet (50 mg total) by mouth as needed for erectile dysfunction.   Marland Kitchen atorvastatin (LIPITOR) 40 MG tablet TAKE 1 TABLET BY MOUTH AT  BEDTIME (Patient not taking: Reported on 02/14/2018) 02/14/2018: Muscle pains   No facility-administered encounter medications on file as of 02/14/2018.     Patient Care Team: Maple Hudson., MD as PCP - General (Family Medicine)      Objective:   Vitals:  Vitals:   02/14/18 1410  BP:  132/84  Pulse: 75  Resp: 14  Temp: 98 F (36.7 C)  TempSrc: Oral  SpO2: 96%  Weight: 179 lb (81.2 kg)    Physical Exam  Constitutional: He is oriented to person, place, and time. He appears well-developed and well-nourished.  HENT:  Head: Normocephalic and atraumatic.  Right Ear: External ear normal.  Left Ear: External ear normal.  Nose: Nose normal.  Mouth/Throat: Oropharynx is clear and moist.  Eyes: Pupils are equal, round, and reactive to light. Conjunctivae and EOM are normal.  Neck: Normal range of motion. Neck supple.  Cardiovascular: Normal rate, regular rhythm, normal heart sounds and intact distal pulses.  Pulmonary/Chest: Effort normal and breath sounds normal.  Abdominal: Soft. Bowel sounds are normal.  Genitourinary: Rectum normal, prostate normal and penis normal.  Musculoskeletal: Normal range of motion.  Neurological: He is alert and oriented to person, place, and time.  Skin: Skin is warm and dry.  Psychiatric: He has a normal mood and affect. His behavior is normal. Judgment and thought content normal.     Depression Screen PHQ 2/9 Scores 02/14/2018 10/15/2017 08/08/2016 12/24/2014  PHQ - 2 Score 0 0 0 0  PHQ- 9 Score - - 1 -      Assessment & Plan:     Routine Health Maintenance and Physical Exam  Exercise Activities and Dietary recommendations Goals   None     Immunization History  Administered Date(s) Administered  . Hepatitis B, adult 08/08/2016  . Pneumococcal Polysaccharide-23 08/08/2016  . Td 12/02/2003, 12/09/2012    Health Maintenance  Topic Date Due  . FOOT EXAM  05/17/1968  . HIV Screening  05/17/1973  . OPHTHALMOLOGY EXAM  07/26/2017  . INFLUENZA VACCINE  12/13/2017  . HEMOGLOBIN A1C  04/16/2018  . TETANUS/TDAP  12/10/2022  . COLONOSCOPY  03/22/2025  . PNEUMOCOCCAL POLYSACCHARIDE VACCINE AGE 59-64 HIGH RISK  Completed  . Hepatitis C Screening  Completed     Discussed health benefits of physical activity, and encouraged  him to engage in regular exercise appropriate for his age and condition.  1. Annual physical exam  - CBC with Differential/Platelet - Comprehensive metabolic panel - Lipid Panel With LDL/HDL Ratio - TSH - POCT Urinalysis Dipstick  2. Prostate cancer screening  - PSA  3. Type 2 diabetes mellitus without complication, without long-term current use of insulin (HCC)  - Hemoglobin A1c - POCT UA - Microalbumin  4. Need for influenza vaccination  - Flu Vaccine QUAD 6+ mos PF IM (Fluarix Quad PF)  5. Tinnitus of both ears   6. Sensorineural hearing loss (SNHL) of both ears   I have done the exam  and reviewed the chart and it is accurate to the best of my knowledge. Dentist has been used and  any errors in dictation or transcription are unintentional. Julieanne Manson M.D. [facility]

## 2018-02-19 DIAGNOSIS — Z0000 Encounter for general adult medical examination without abnormal findings: Secondary | ICD-10-CM | POA: Diagnosis not present

## 2018-02-19 DIAGNOSIS — E119 Type 2 diabetes mellitus without complications: Secondary | ICD-10-CM | POA: Diagnosis not present

## 2018-02-19 DIAGNOSIS — Z125 Encounter for screening for malignant neoplasm of prostate: Secondary | ICD-10-CM | POA: Diagnosis not present

## 2018-02-20 ENCOUNTER — Telehealth: Payer: Self-pay

## 2018-02-20 DIAGNOSIS — E7849 Other hyperlipidemia: Secondary | ICD-10-CM

## 2018-02-20 LAB — CBC WITH DIFFERENTIAL/PLATELET
Basophils Absolute: 0 x10E3/uL (ref 0.0–0.2)
Basos: 1 %
EOS (ABSOLUTE): 0.3 x10E3/uL (ref 0.0–0.4)
Eos: 4 %
Hematocrit: 42 % (ref 37.5–51.0)
Hemoglobin: 14 g/dL (ref 13.0–17.7)
Immature Grans (Abs): 0 x10E3/uL (ref 0.0–0.1)
Immature Granulocytes: 0 %
Lymphocytes Absolute: 2.3 x10E3/uL (ref 0.7–3.1)
Lymphs: 36 %
MCH: 29.7 pg (ref 26.6–33.0)
MCHC: 33.3 g/dL (ref 31.5–35.7)
MCV: 89 fL (ref 79–97)
Monocytes Absolute: 0.4 x10E3/uL (ref 0.1–0.9)
Monocytes: 6 %
Neutrophils Absolute: 3.3 x10E3/uL (ref 1.4–7.0)
Neutrophils: 53 %
Platelets: 283 x10E3/uL (ref 150–450)
RBC: 4.71 x10E6/uL (ref 4.14–5.80)
RDW: 12.8 % (ref 12.3–15.4)
WBC: 6.2 x10E3/uL (ref 3.4–10.8)

## 2018-02-20 LAB — PSA: Prostate Specific Ag, Serum: 0.5 ng/mL (ref 0.0–4.0)

## 2018-02-20 LAB — COMPREHENSIVE METABOLIC PANEL WITH GFR
ALT: 37 IU/L (ref 0–44)
AST: 21 IU/L (ref 0–40)
Albumin/Globulin Ratio: 1.6 (ref 1.2–2.2)
Albumin: 4.5 g/dL (ref 3.5–5.5)
Alkaline Phosphatase: 71 IU/L (ref 39–117)
BUN/Creatinine Ratio: 21 — ABNORMAL HIGH (ref 9–20)
BUN: 19 mg/dL (ref 6–24)
Bilirubin Total: 0.6 mg/dL (ref 0.0–1.2)
CO2: 25 mmol/L (ref 20–29)
Calcium: 9.4 mg/dL (ref 8.7–10.2)
Chloride: 98 mmol/L (ref 96–106)
Creatinine, Ser: 0.91 mg/dL (ref 0.76–1.27)
GFR calc Af Amer: 106 mL/min/1.73
GFR calc non Af Amer: 92 mL/min/1.73
Globulin, Total: 2.8 g/dL (ref 1.5–4.5)
Glucose: 137 mg/dL — ABNORMAL HIGH (ref 65–99)
Potassium: 4.7 mmol/L (ref 3.5–5.2)
Sodium: 139 mmol/L (ref 134–144)
Total Protein: 7.3 g/dL (ref 6.0–8.5)

## 2018-02-20 LAB — LIPID PANEL WITH LDL/HDL RATIO
Cholesterol, Total: 250 mg/dL — ABNORMAL HIGH (ref 100–199)
HDL: 47 mg/dL (ref >39)
LDL CALC: 158 mg/dL — AB (ref 0–99)
LDL/HDL RATIO: 3.4 ratio (ref 0.0–3.6)
TRIGLYCERIDES: 223 mg/dL — AB (ref 0–149)
VLDL Cholesterol Cal: 45 mg/dL — ABNORMAL HIGH (ref 5–40)

## 2018-02-20 LAB — HEMOGLOBIN A1C
ESTIMATED AVERAGE GLUCOSE: 154 mg/dL
Hgb A1c MFr Bld: 7 % — ABNORMAL HIGH (ref 4.8–5.6)

## 2018-02-20 LAB — TSH: TSH: 2.42 u[IU]/mL (ref 0.450–4.500)

## 2018-02-20 MED ORDER — EZETIMIBE 10 MG PO TABS: 10.0000 mg | ORAL_TABLET | Freq: Every day | ORAL | 3 refills | Status: DC

## 2018-02-20 NOTE — Telephone Encounter (Signed)
Left message to call back  

## 2018-02-20 NOTE — Telephone Encounter (Signed)
-----   Message from Maple Hudson., MD sent at 02/20/2018  1:14 PM EDT ----- Need to treat lipids with nonstatin--try Zetia 10mg  q am--RTC 1 month.Should not have any side effects.

## 2018-02-20 NOTE — Telephone Encounter (Signed)
Advised patient as below. Medication was sent into the pharmacy.  

## 2018-02-22 ENCOUNTER — Telehealth: Payer: Self-pay | Admitting: Family Medicine

## 2018-02-22 NOTE — Telephone Encounter (Signed)
Pt is being by [facility] told they do not have losartan (COZAAR) 50 MG tablet.  It is on back order and do not know when they will get more in. Pt wanting to know what Dr. Sullivan Lone wants him to do since he is running low and doesn't want to run out.   Please advise.  Thanks, [employer]

## 2018-02-25 NOTE — Telephone Encounter (Signed)
Please review for change in medicine

## 2018-03-28 ENCOUNTER — Ambulatory Visit (INDEPENDENT_AMBULATORY_CARE_PROVIDER_SITE_OTHER): Payer: BLUE CROSS/BLUE SHIELD | Admitting: Family Medicine

## 2018-03-28 ENCOUNTER — Encounter: Payer: Self-pay | Admitting: Family Medicine

## 2018-03-28 VITALS — BP 136/88 | HR 68 | Temp 98.2°F | Resp 16 | Wt 176.0 lb

## 2018-03-28 DIAGNOSIS — M25512 Pain in left shoulder: Secondary | ICD-10-CM | POA: Diagnosis not present

## 2018-03-28 DIAGNOSIS — M791 Myalgia, unspecified site: Secondary | ICD-10-CM

## 2018-03-28 DIAGNOSIS — E7800 Pure hypercholesterolemia, unspecified: Secondary | ICD-10-CM | POA: Diagnosis not present

## 2018-03-28 DIAGNOSIS — G8929 Other chronic pain: Secondary | ICD-10-CM

## 2018-03-28 NOTE — Progress Notes (Signed)
Patient: Jason Glenn Male    DOB: 02/19/1961   57 y.o.   MRN: [id number] Visit Date: 03/28/2018  Today's Provider: Megan Mansichard Hatim Homann Jr, MD   Chief Complaint  Patient presents with  . Hyperlipidemia    One month follow up   Subjective:    Hyperlipidemia  This is a chronic (Pt started Zetia 10mg  daily.) problem. The problem is uncontrolled. Recent lipid tests were reviewed and are high. Exacerbating diseases include diabetes. Associated symptoms include myalgias (bilateral arms; left worse than right.). Pertinent negatives include no chest pain, focal sensory loss, focal weakness, leg pain or shortness of breath. Current antihyperlipidemic treatment includes ezetimibe. Compliance problems include medication side effects.   Does complain of recent left shoulder pain when he abducts the left shoulder beyond 90 degrees.   Lab Results  Component Value Date   CHOL 250 (H) 02/19/2018   CHOL 165 05/11/2017   CHOL 131 09/04/2016   Lab Results  Component Value Date   HDL 47 02/19/2018   HDL 51 05/11/2017   HDL 54 09/04/2016   Lab Results  Component Value Date   LDLCALC 158 (H) 02/19/2018   LDLCALC 84 05/11/2017   LDLCALC 60 09/04/2016   Lab Results  Component Value Date   TRIG 223 (H) 02/19/2018   TRIG 149 05/11/2017   TRIG 83 09/04/2016   No results found for: CHOLHDL No results found for: LDLDIRECT Wt Readings from Last 3 Encounters:  03/28/18 176 lb (79.8 kg)  02/14/18 179 lb (81.2 kg)  10/15/17 175 lb (79.4 kg)       Allergies  Allergen Reactions  . Pravastatin Sodium     Other reaction(s): Muscle Cramps     Current Outpatient Medications:  .  aspirin 81 MG tablet, Take 81 mg by mouth daily., Disp: , Rfl:  .  B Complex-C-Folic Acid (HM SUPER VITAMIN B COMPLEX/C PO), Take by mouth., Disp: , Rfl:  .  Chromium-Cinnamon (CINNAMON PLUS CHROMIUM) [phone] MCG-MG CAPS, Take by mouth., Disp: , Rfl:  .  Coenzyme Q10 (CO Q-10) 100 MG CAPS, Take by mouth., Disp:  , Rfl:  .  ezetimibe (ZETIA) 10 MG tablet, Take 1 tablet (10 mg total) by mouth daily., Disp: 90 tablet, Rfl: 3 .  Flaxseed, Linseed, (FLAXSEED OIL) 1200 MG CAPS, Take by mouth., Disp: , Rfl:  .  Garlic 1000 MG CAPS, Take by mouth., Disp: , Rfl:  .  losartan (COZAAR) 50 MG tablet, TAKE 1 TABLET BY MOUTH  DAILY, Disp: 90 tablet, Rfl: 3 .  metFORMIN (GLUCOPHAGE) 500 MG tablet, TAKE 1 TABLET BY MOUTH TWO  TIMES DAILY WITH A MEAL, Disp: 180 tablet, Rfl: 3 .  Misc Natural Products (DAILY HERBS PROSTATE PO), Take by mouth., Disp: , Rfl:  .  Omega-3 Fatty Acids (ULTRA OMEGA 3) 952 MG CAPS, Take by mouth., Disp: , Rfl:  .  ONE TOUCH ULTRA TEST test strip, TEST SUGAR ONCE DAILY, Disp: 100 each, Rfl: 12 .  Red Yeast Rice 600 MG TABS, Take by mouth., Disp: , Rfl:  .  sildenafil (VIAGRA) 50 MG tablet, Take 1 tablet (50 mg total) by mouth as needed for erectile dysfunction., Disp: 6 tablet, Rfl: 5 .  vitamin E 1000 UNIT capsule, Take 1,000 Units by mouth daily., Disp: , Rfl:  .  Vitamins-Lipotropics (LIPO-FLAVONOID PLUS PO), Take by mouth., Disp: , Rfl:  .  Omega-3 Fatty Acids (FISH OIL) 1000 MG CPDR, Take by mouth., Disp: , Rfl:  .  Potassium Gluconate 550 MG TABS, Take by mouth., Disp: , Rfl:   Review of Systems  Constitutional: Negative.   HENT: Negative.   Eyes: Negative.   Respiratory: Negative.  Negative for shortness of breath.   Cardiovascular: Negative.  Negative for chest pain.  Gastrointestinal: Negative.   Endocrine: Negative.   Musculoskeletal: Positive for myalgias (bilateral arms; left worse than right.). Negative for arthralgias, back pain, gait problem, joint swelling, neck pain and neck stiffness.  Allergic/Immunologic: Negative.   Neurological: Negative for dizziness, focal weakness, light-headedness and headaches.  Hematological: Negative.   Psychiatric/Behavioral: Negative.     Social History   Tobacco Use  . Smoking status: Never Smoker  . Smokeless tobacco: Never Used    Substance Use Topics  . Alcohol use: No   Objective:   BP 136/88 (BP Location: Left Arm, Patient Position: Sitting, Cuff Size: Normal)   Pulse 68   Temp 98.2 F (36.8 C) (Oral)   Resp 16   Wt 176 lb (79.8 kg)   BMI 29.29 kg/m  Vitals:   03/28/18 1512  BP: 136/88  Pulse: 68  Resp: 16  Temp: 98.2 F (36.8 C)  TempSrc: Oral  Weight: 176 lb (79.8 kg)     Physical Exam  Constitutional: He is oriented to person, place, and time. He appears well-developed and well-nourished.  HENT:  Head: Normocephalic and atraumatic.  Eyes: Conjunctivae are normal. No scleral icterus.  Neck: No thyromegaly present.  Cardiovascular: Normal rate, regular rhythm and normal heart sounds.  Pulmonary/Chest: Effort normal and breath sounds normal.  Abdominal: Soft.  Musculoskeletal: He exhibits no edema.  Neurological: He is alert and oriented to person, place, and time.  Skin: Skin is warm and dry.  Psychiatric: He has a normal mood and affect. His behavior is normal. Judgment and thought content normal.        Assessment & Plan:      1. Pure hypercholesterolemia  - Lipid Panel With LDL/HDL Ratio  2. Myalgia  - CK  3. Chronic left shoulder pain Most likely shoulder arthropathy,may need ortho referral.      I have done the exam and reviewed the above chart and it is accurate to the best of my knowledge. Dentist has been used in this note in any air is in the dictation or transcription are unintentional.  [name], MD  [facility]

## 2018-04-02 ENCOUNTER — Telehealth: Payer: Self-pay | Admitting: *Deleted

## 2018-04-02 NOTE — Telephone Encounter (Signed)
Please review. Thanks!  

## 2018-04-02 NOTE — Telephone Encounter (Signed)
Patient's wife [name] called office concerning losartan rx. [name] stated [facility] now has losartan in stock and patient already has refills pending. Sharon's question is whether Dr. Sullivan LoneGilbert wants patient to continue losartan or if he is going to change to a different blood pressure medication? Please advise?

## 2018-04-02 NOTE — Telephone Encounter (Signed)
continue

## 2018-04-03 NOTE — Telephone Encounter (Signed)
Jason Glenn was advised.  

## 2018-04-19 ENCOUNTER — Telehealth: Payer: Self-pay | Admitting: Family Medicine

## 2018-04-19 ENCOUNTER — Other Ambulatory Visit: Payer: Self-pay | Admitting: Family Medicine

## 2018-04-19 DIAGNOSIS — E119 Type 2 diabetes mellitus without complications: Secondary | ICD-10-CM

## 2018-04-19 MED ORDER — LOSARTAN POTASSIUM 50 MG PO TABS: 50.0000 mg | ORAL_TABLET | Freq: Every day | ORAL | 0 refills | Status: DC

## 2018-04-19 NOTE — Telephone Encounter (Signed)
Done

## 2018-04-19 NOTE — Telephone Encounter (Signed)
Pt needing an emergency 30 day supply of: losartan (COZAAR) 50 MG tablet - pt forgot to call in intime for his Rx to continue. Mail order will be sending his regular refill in 30 days.  Please fill at:  [facility] [address] - [place], [place] - [id number] [phone] (Phone) [phone] (Fax)   Thanks, [facility]

## 2018-04-19 NOTE — Telephone Encounter (Signed)
Patient was advised.  

## 2018-04-19 NOTE — Telephone Encounter (Signed)
Patient is requesting a 30 day supply of losartan 50 mg qd be sent to [facility] [address], so he will not run out of med. Patient Is waiting for mail order supply that was was also just requested. Please advise?

## 2018-04-22 MED ORDER — TELMISARTAN 20 MG PO TABS: 20.0000 mg | ORAL_TABLET | Freq: Every day | ORAL | 3 refills | Status: DC

## 2018-04-22 NOTE — Telephone Encounter (Signed)
Telmesartan 20mg  daily.

## 2018-05-06 DIAGNOSIS — M791 Myalgia, unspecified site: Secondary | ICD-10-CM | POA: Diagnosis not present

## 2018-05-06 DIAGNOSIS — E7800 Pure hypercholesterolemia, unspecified: Secondary | ICD-10-CM | POA: Diagnosis not present

## 2018-05-07 LAB — LIPID PANEL WITH LDL/HDL RATIO
CHOLESTEROL TOTAL: 217 mg/dL — AB (ref 100–199)
HDL: 48 mg/dL (ref >39)
LDL CALC: 130 mg/dL — AB (ref 0–99)
LDl/HDL Ratio: 2.7 ratio (ref 0.0–3.6)
TRIGLYCERIDES: 195 mg/dL — AB (ref 0–149)
VLDL CHOLESTEROL CAL: 39 mg/dL (ref 5–40)

## 2018-05-07 LAB — CK: Total CK: 95 U/L (ref 24–204)

## 2018-05-14 ENCOUNTER — Telehealth: Payer: Self-pay

## 2018-05-14 NOTE — Telephone Encounter (Signed)
-----   Message from Maple Hudsonichard L Gilbert Jr., MD sent at 05/11/2018  2:20 PM EST ----- Improved.

## 2018-05-14 NOTE — Telephone Encounter (Signed)
LMTCB 05/14/2018  Thanks,   -Rich Paprocki  

## 2018-05-16 NOTE — Telephone Encounter (Signed)
Patient's wife Jasmine December is on the [facility] and was advised as directed below.  Thanks,  -Joseline

## 2018-06-17 ENCOUNTER — Encounter: Payer: Self-pay | Admitting: Family Medicine

## 2018-06-17 ENCOUNTER — Ambulatory Visit: Payer: BLUE CROSS/BLUE SHIELD | Admitting: Family Medicine

## 2018-06-17 VITALS — BP 132/74 | HR 74 | Temp 97.6°F | Wt 178.4 lb

## 2018-06-17 DIAGNOSIS — E119 Type 2 diabetes mellitus without complications: Secondary | ICD-10-CM | POA: Diagnosis not present

## 2018-06-17 DIAGNOSIS — J101 Influenza due to other identified influenza virus with other respiratory manifestations: Secondary | ICD-10-CM | POA: Diagnosis not present

## 2018-06-17 DIAGNOSIS — R6889 Other general symptoms and signs: Secondary | ICD-10-CM

## 2018-06-17 LAB — POCT GLYCOSYLATED HEMOGLOBIN (HGB A1C)
Est. average glucose Bld gHb Est-mCnc: 180
HEMOGLOBIN A1C: 7.9 % — AB (ref 4.0–5.6)

## 2018-06-17 LAB — POCT INFLUENZA A/B
INFLUENZA A, POC: POSITIVE — AB
INFLUENZA B, POC: NEGATIVE

## 2018-06-17 MED ORDER — METFORMIN HCL 1000 MG PO TABS: 1000.0000 mg | ORAL_TABLET | Freq: Two times a day (BID) | ORAL | 3 refills | Status: DC

## 2018-06-17 NOTE — Progress Notes (Signed)
Patient: Jason Glenn Male    DOB: 21-Jan-1961   58 y.o.   MRN: [id number] Visit Date: 06/17/2018  Today's Provider: [name], MD   Chief Complaint  Patient presents with  . Hyperlipidemia  . URI   Subjective:    URI   The current episode started in the past 7 days. The maximum temperature recorded prior to his arrival was 100.4 - 100.9 F. The fever has been present for 1 to 2 days. Associated symptoms include congestion, coughing and a sore throat (Little bit per patient). Pertinent negatives include no chest pain, headaches or wheezing. He has tried NSAIDs for the symptoms. The treatment provided mild relief.    Lipid/Cholesterol, Follow-up:   Last seen for this 3 months ago.  Management changes since that visit include none. . Last Lipid Panel:    Component Value Date/Time   CHOL 217 (H) 05/06/2018 0843   TRIG 195 (H) 05/06/2018 0843   HDL 48 05/06/2018 0843   LDLCALC 130 (H) 05/06/2018 4098    Risk factors for vascular disease include hypercholesterolemia  He reports good compliance with treatment. He is not having side effects.  Current symptoms include none and have been stable. Weight trend: fluctuating a bit Prior visit with dietician: no Current diet: well balanced Current exercise: none  Wt Readings from Last 3 Encounters:  06/17/18 178 lb 6.4 oz (80.9 kg)  03/28/18 176 lb (79.8 kg)  02/14/18 179 lb (81.2 kg)   Also needs follow-up of diabetes. -------------------------------------------------------------------     Allergies  Allergen Reactions  . Pravastatin Sodium     Other reaction(s): Muscle Cramps     Current Outpatient Medications:  .  aspirin 81 MG tablet, Take 81 mg by mouth daily., Disp: , Rfl:  .  B Complex-C-Folic Acid (HM SUPER VITAMIN B COMPLEX/C PO), Take by mouth., Disp: , Rfl:  .  Chromium-Cinnamon (CINNAMON PLUS CHROMIUM) [phone] MCG-MG CAPS, Take by mouth., Disp: , Rfl:  .  Coenzyme Q10 (CO Q-10) 100 MG CAPS,  Take by mouth., Disp: , Rfl:  .  ezetimibe (ZETIA) 10 MG tablet, Take 1 tablet (10 mg total) by mouth daily., Disp: 90 tablet, Rfl: 3 .  Flaxseed, Linseed, (FLAXSEED OIL) 1200 MG CAPS, Take by mouth., Disp: , Rfl:  .  Garlic 1000 MG CAPS, Take by mouth., Disp: , Rfl:  .  losartan (COZAAR) 50 MG tablet, Take 1 tablet (50 mg total) by mouth daily., Disp: 30 tablet, Rfl: 0 .  metFORMIN (GLUCOPHAGE) 500 MG tablet, TAKE 1 TABLET BY MOUTH TWO  TIMES DAILY WITH A MEAL, Disp: 180 tablet, Rfl: 3 .  Misc Natural Products (DAILY HERBS PROSTATE PO), Take by mouth., Disp: , Rfl:  .  Omega-3 Fatty Acids (FISH OIL) 1000 MG CPDR, Take by mouth., Disp: , Rfl:  .  Omega-3 Fatty Acids (ULTRA OMEGA 3) 952 MG CAPS, Take by mouth., Disp: , Rfl:  .  ONE TOUCH ULTRA TEST test strip, TEST SUGAR ONCE DAILY, Disp: 100 each, Rfl: 12 .  Potassium Gluconate 550 MG TABS, Take by mouth., Disp: , Rfl:  .  Red Yeast Rice 600 MG TABS, Take by mouth., Disp: , Rfl:  .  sildenafil (VIAGRA) 50 MG tablet, Take 1 tablet (50 mg total) by mouth as needed for erectile dysfunction., Disp: 6 tablet, Rfl: 5 .  telmisartan (MICARDIS) 20 MG tablet, Take 1 tablet (20 mg total) by mouth daily., Disp: 90 tablet, Rfl: 3 .  vitamin E 1000 UNIT capsule, Take 1,000 Units by mouth daily., Disp: , Rfl:  .  Vitamins-Lipotropics (LIPO-FLAVONOID PLUS PO), Take by mouth., Disp: , Rfl:   Review of Systems  Constitutional: Positive for fever.  HENT: Positive for congestion and sore throat (Little bit per patient).   Eyes: Negative.   Respiratory: Positive for cough. Negative for shortness of breath and wheezing.   Cardiovascular: Negative for chest pain, palpitations and leg swelling.  Genitourinary: Negative.   Allergic/Immunologic: Negative.   Neurological: Negative.  Negative for weakness and headaches.  Hematological: Negative.   Psychiatric/Behavioral: Negative.     Social History   Tobacco Use  . Smoking status: Never Smoker  . Smokeless  tobacco: Never Used  Substance Use Topics  . Alcohol use: No      Objective:   BP 132/74 (BP Location: Left Arm, Patient Position: Sitting, Cuff Size: Normal)   Pulse 74   Temp 97.6 F (36.4 C) (Oral)   Wt 178 lb 6.4 oz (80.9 kg)   SpO2 97%   BMI 29.69 kg/m  Vitals:   06/17/18 1428  BP: 132/74  Pulse: 74  Temp: 97.6 F (36.4 C)  TempSrc: Oral  SpO2: 97%  Weight: 178 lb 6.4 oz (80.9 kg)     Physical Exam Constitutional:      Appearance: Normal appearance. He is well-developed and normal weight.  HENT:     Head: Normocephalic and atraumatic.     Right Ear: Tympanic membrane and external ear normal.     Left Ear: Tympanic membrane and external ear normal.     Nose: Nose normal.     Mouth/Throat:     Pharynx: Oropharynx is clear.  Eyes:     Conjunctiva/sclera: Conjunctivae normal.     Pupils: Pupils are equal, round, and reactive to light.  Neck:     Musculoskeletal: Normal range of motion and neck supple.  Cardiovascular:     Rate and Rhythm: Normal rate and regular rhythm.     Heart sounds: Normal heart sounds.  Pulmonary:     Effort: Pulmonary effort is normal.     Breath sounds: Normal breath sounds.  Abdominal:     General: Bowel sounds are normal.     Palpations: Abdomen is soft.  Musculoskeletal: Normal range of motion.  Skin:    General: Skin is warm and dry.  Neurological:     General: No focal deficit present.     Mental Status: He is alert and oriented to person, place, and time. Mental status is at baseline.  Psychiatric:        Behavior: Behavior normal.        Thought Content: Thought content normal.        Judgment: Judgment normal.   Flu swab positive for influenza A      Assessment & Plan     1. Type 2 diabetes mellitus without complication, without long-term current use of insulin (HCC) Double metformin to 1000 g twice daily.  Consider adding Invokana or Trulicity on next visit. - POCT glycosylated hemoglobin (Hb A1C)--7.9 -  metFORMIN (GLUCOPHAGE) 1000 MG tablet; Take 1 tablet (1,000 mg total) by mouth 2 (two) times daily with a meal.  Dispense: 180 tablet; Refill: 3  2. Flu-like symptoms  - POCT Influenza A/B--A positive,B negative  3. Influenza A Too late to treat with Tamiflu.  Encouraged rest, push fluids, Tylenol for pain 4.  Statin intolerance  I, Porsha McClurkin, CMA, am acting as a Neurosurgeonscribe for Assurantichard  Wendelyn BreslowGilbert Jr, MD.     I have done the exam and reviewed the above chart and it is accurate to the best of my knowledge. DentistDragon  technology has been used in this note in any air is in the dictation or transcription are unintentional.  Megan Mansichard Richards Pherigo Jr, MD  [facility]

## 2018-08-02 ENCOUNTER — Other Ambulatory Visit: Payer: Self-pay | Admitting: Family Medicine

## 2018-08-02 DIAGNOSIS — E119 Type 2 diabetes mellitus without complications: Secondary | ICD-10-CM

## 2018-08-02 NOTE — Telephone Encounter (Signed)
[facility] faxed refill request for the following medications:  metFORMIN (GLUCOPHAGE) 1000 MG tablet    Please advise.

## 2018-08-05 MED ORDER — METFORMIN HCL 1000 MG PO TABS: 1000.0000 mg | ORAL_TABLET | Freq: Two times a day (BID) | ORAL | 3 refills | Status: DC

## 2018-08-05 NOTE — Telephone Encounter (Signed)
Please review. Thanks!  

## 2018-09-16 ENCOUNTER — Ambulatory Visit: Payer: Self-pay | Admitting: Family Medicine

## 2018-09-19 ENCOUNTER — Other Ambulatory Visit: Payer: Self-pay

## 2018-09-19 DIAGNOSIS — N529 Male erectile dysfunction, unspecified: Secondary | ICD-10-CM

## 2018-09-19 MED ORDER — SILDENAFIL CITRATE 50 MG PO TABS: 50.0000 mg | ORAL_TABLET | ORAL | 5 refills | Status: DC | PRN

## 2018-10-04 ENCOUNTER — Other Ambulatory Visit: Payer: Self-pay | Admitting: Family Medicine

## 2018-10-04 DIAGNOSIS — E7849 Other hyperlipidemia: Secondary | ICD-10-CM

## 2018-11-06 ENCOUNTER — Ambulatory Visit (INDEPENDENT_AMBULATORY_CARE_PROVIDER_SITE_OTHER): Payer: BC Managed Care – PPO | Admitting: Family Medicine

## 2018-11-06 ENCOUNTER — Other Ambulatory Visit: Payer: Self-pay

## 2018-11-06 ENCOUNTER — Encounter: Payer: Self-pay | Admitting: Family Medicine

## 2018-11-06 VITALS — BP 132/74 | HR 70 | Temp 98.4°F | Resp 16 | Wt 180.0 lb

## 2018-11-06 DIAGNOSIS — E119 Type 2 diabetes mellitus without complications: Secondary | ICD-10-CM | POA: Diagnosis not present

## 2018-11-06 DIAGNOSIS — E7800 Pure hypercholesterolemia, unspecified: Secondary | ICD-10-CM

## 2018-11-06 DIAGNOSIS — I10 Essential (primary) hypertension: Secondary | ICD-10-CM

## 2018-11-06 LAB — POCT GLYCOSYLATED HEMOGLOBIN (HGB A1C)
Est. average glucose Bld gHb Est-mCnc: 169
Hemoglobin A1C: 7.5 % — AB (ref 4.0–5.6)

## 2018-11-06 NOTE — Progress Notes (Signed)
Patient: Jason Glenn Male    DOB: 07/10/1960   58 y.o.   MRN: [id number] Visit Date: 11/06/2018  Today's Provider: Megan Mansichard Sian Joles Jr, MD   Chief Complaint  Patient presents with  . Diabetes   Subjective:     HPI  Diabetes Mellitus Type II, Follow-up:   Lab Results  Component Value Date   HGBA1C 7.5 (A) 11/06/2018   HGBA1C 7.9 (A) 06/17/2018   HGBA1C 7.0 (H) 02/19/2018    Last seen for diabetes 4 months ago.  Management since then includes increasing Metformin to 1000mg  twice daily.  He reports good compliance with treatment. He is not having side effects.  Current symptoms include none and have been stable. Home blood sugar records: fasting range: 130s  Episodes of hypoglycemia? no   Current Insulin Regimen: none Most Recent Eye Exam: up to date Weight trend: stable Prior visit with dietician: No Current exercise: no regular exercise Current diet habits: well balanced  Pertinent Labs:    Component Value Date/Time   CHOL 217 (H) 05/06/2018 0843   TRIG 195 (H) 05/06/2018 0843   HDL 48 05/06/2018 0843   LDLCALC 130 (H) 05/06/2018 0843   CREATININE 0.91 02/19/2018 0957    Wt Readings from Last 3 Encounters:  11/06/18 180 lb (81.6 kg)  06/17/18 178 lb 6.4 oz (80.9 kg)  03/28/18 176 lb (79.8 kg)   Allergies  Allergen Reactions  . Pravastatin Sodium     Other reaction(s): Muscle Cramps     Current Outpatient Medications:  .  aspirin 81 MG tablet, Take 81 mg by mouth daily., Disp: , Rfl:  .  B Complex-C-Folic Acid (HM SUPER VITAMIN B COMPLEX/C PO), Take by mouth., Disp: , Rfl:  .  Chromium-Cinnamon (CINNAMON PLUS CHROMIUM) [phone] MCG-MG CAPS, Take by mouth., Disp: , Rfl:  .  Coenzyme Q10 (CO Q-10) 100 MG CAPS, Take by mouth., Disp: , Rfl:  .  ezetimibe (ZETIA) 10 MG tablet, TAKE 1 TABLET BY MOUTH  DAILY, Disp: 90 tablet, Rfl: 3 .  Flaxseed, Linseed, (FLAXSEED OIL) 1200 MG CAPS, Take by mouth., Disp: , Rfl:  .  Garlic 1000 MG CAPS, Take by  mouth., Disp: , Rfl:  .  losartan (COZAAR) 50 MG tablet, TAKE 1 TABLET BY MOUTH  DAILY, Disp: 90 tablet, Rfl: 3 .  metFORMIN (GLUCOPHAGE) 1000 MG tablet, Take 1 tablet (1,000 mg total) by mouth 2 (two) times daily with a meal., Disp: 180 tablet, Rfl: 3 .  Misc Natural Products (DAILY HERBS PROSTATE PO), Take by mouth., Disp: , Rfl:  .  Omega-3 Fatty Acids (FISH OIL) 1000 MG CPDR, Take by mouth., Disp: , Rfl:  .  Omega-3 Fatty Acids (ULTRA OMEGA 3) 952 MG CAPS, Take by mouth., Disp: , Rfl:  .  ONETOUCH ULTRA test strip, TEST SUGAR ONCE DAILY, Disp: 100 each, Rfl: 3 .  Potassium Gluconate 550 MG TABS, Take by mouth., Disp: , Rfl:  .  Red Yeast Rice 600 MG TABS, Take by mouth., Disp: , Rfl:  .  sildenafil (VIAGRA) 50 MG tablet, Take 1 tablet (50 mg total) by mouth as needed for erectile dysfunction., Disp: 6 tablet, Rfl: 5 .  telmisartan (MICARDIS) 20 MG tablet, Take 1 tablet (20 mg total) by mouth daily., Disp: 90 tablet, Rfl: 3 .  vitamin E 1000 UNIT capsule, Take 1,000 Units by mouth daily., Disp: , Rfl:  .  Vitamins-Lipotropics (LIPO-FLAVONOID PLUS PO), Take by mouth., Disp: , Rfl:  Review of Systems  Constitutional: Negative for activity change, appetite change, diaphoresis and fatigue.  Respiratory: Negative for cough and shortness of breath.   Cardiovascular: Negative for chest pain, palpitations and leg swelling.  Endocrine: Negative for cold intolerance, heat intolerance, polydipsia, polyphagia and polyuria.  Musculoskeletal: Negative for arthralgias.  Neurological: Negative for dizziness, light-headedness and headaches.  Psychiatric/Behavioral: Negative for agitation, self-injury, sleep disturbance and suicidal ideas. The patient is not nervous/anxious.     Social History   Tobacco Use  . Smoking status: Never Smoker  . Smokeless tobacco: Never Used  Substance Use Topics  . Alcohol use: No      Objective:   BP 132/74 (BP Location: Left Arm, Patient Position: Sitting, Cuff  Size: Normal)   Pulse 70   Temp 98.4 F (36.9 C)   Resp 16   Wt 180 lb (81.6 kg)   SpO2 97%   BMI 29.95 kg/m  Vitals:   11/06/18 1618  BP: 132/74  Pulse: 70  Resp: 16  Temp: 98.4 F (36.9 C)  SpO2: 97%  Weight: 180 lb (81.6 kg)     Physical Exam Vitals signs reviewed.  Constitutional:      Appearance: Normal appearance. He is well-developed and normal weight.  HENT:     Head: Normocephalic and atraumatic.     Right Ear: Tympanic membrane and external ear normal.     Left Ear: Tympanic membrane and external ear normal.     Nose: Nose normal.     Mouth/Throat:     Pharynx: Oropharynx is clear.  Eyes:     Conjunctiva/sclera: Conjunctivae normal.     Pupils: Pupils are equal, round, and reactive to light.  Neck:     Musculoskeletal: Normal range of motion and neck supple.  Cardiovascular:     Rate and Rhythm: Normal rate and regular rhythm.     Heart sounds: Normal heart sounds.  Pulmonary:     Effort: Pulmonary effort is normal.     Breath sounds: Normal breath sounds.  Abdominal:     General: Bowel sounds are normal.     Palpations: Abdomen is soft.  Musculoskeletal: Normal range of motion.  Skin:    General: Skin is warm and dry.  Neurological:     General: No focal deficit present.     Mental Status: He is alert and oriented to person, place, and time. Mental status is at baseline.  Psychiatric:        Behavior: Behavior normal.        Thought Content: Thought content normal.        Judgment: Judgment normal.      Results for orders placed or performed in visit on 11/06/18  POCT glycosylated hemoglobin (Hb A1C)  Result Value Ref Range   Hemoglobin A1C 7.5 (A) 4.0 - 5.6 %   HbA1c POC (<> result, manual entry)     HbA1c, POC (prediabetic range)     HbA1c, POC (controlled diabetic range)     Est. average glucose Bld gHb Est-mCnc 169        Assessment & Plan    1. Type 2 diabetes mellitus without complication, without long-term current use of  insulin (HCC) Urine microalbumin on next visit. - POCT glycosylated hemoglobin (Hb A1C)--7.5 today.  2. Pure hypercholesterolemia Statin intolerant due to myalgias. 3.HTN On ARB.   I have done the exam and reviewed the above chart and it is accurate to the best of my knowledge. Development worker, community has been used  in this note in any air is in the dictation or transcription are unintentional.  Wilhemena Durie, MD  [facility]

## 2018-12-02 LAB — HM DIABETES EYE EXAM

## 2019-02-11 DIAGNOSIS — D2272 Melanocytic nevi of left lower limb, including hip: Secondary | ICD-10-CM | POA: Diagnosis not present

## 2019-02-11 DIAGNOSIS — D2262 Melanocytic nevi of left upper limb, including shoulder: Secondary | ICD-10-CM | POA: Diagnosis not present

## 2019-02-11 DIAGNOSIS — Z1283 Encounter for screening for malignant neoplasm of skin: Secondary | ICD-10-CM | POA: Diagnosis not present

## 2019-02-11 DIAGNOSIS — D225 Melanocytic nevi of trunk: Secondary | ICD-10-CM | POA: Diagnosis not present

## 2019-02-11 DIAGNOSIS — L570 Actinic keratosis: Secondary | ICD-10-CM | POA: Diagnosis not present

## 2019-03-10 NOTE — Progress Notes (Signed)
Patient: Jason Glenn Male    DOB: 06/09/1960   58 y.o.   MRN: [id number] Visit Date: 03/11/2019  Today's Provider: Megan Mansichard Gilbert Jr, MD   Chief Complaint  Patient presents with  . Follow-up  . Diabetes   Subjective:    HPI  Diabetes Mellitus Type II, Follow-up:   Lab Results  Component Value Date   HGBA1C 7.0 (A) 03/11/2019   HGBA1C 7.5 (A) 11/06/2018   HGBA1C 7.9 (A) 06/17/2018    Last seen for diabetes 4 months ago.  Management since then includes no changes. He reports good compliance with treatment. He is not having side effects.  Current symptoms include none and have been stable.  Most Recent Eye Exam: up to date Weight trend: stable Prior visit with dietician: No Current exercise: no regular exercise Current diet habits: well balanced  Pertinent Labs:    Component Value Date/Time   CHOL 217 (H) 05/06/2018 0843   TRIG 195 (H) 05/06/2018 0843   HDL 48 05/06/2018 0843   LDLCALC 130 (H) 05/06/2018 0843   CREATININE 0.91 02/19/2018 0957    Wt Readings from Last 3 Encounters:  03/11/19 179 lb (81.2 kg)  11/06/18 180 lb (81.6 kg)  06/17/18 178 lb 6.4 oz (80.9 kg)    Allergies  Allergen Reactions  . Pravastatin Sodium     Other reaction(s): Muscle Cramps     Current Outpatient Medications:  .  aspirin 81 MG tablet, Take 81 mg by mouth daily., Disp: , Rfl:  .  B Complex-C-Folic Acid (HM SUPER VITAMIN B COMPLEX/C PO), Take by mouth., Disp: , Rfl:  .  Chromium-Cinnamon (CINNAMON PLUS CHROMIUM) [phone] MCG-MG CAPS, Take by mouth., Disp: , Rfl:  .  Coenzyme Q10 (CO Q-10) 100 MG CAPS, Take by mouth., Disp: , Rfl:  .  ezetimibe (ZETIA) 10 MG tablet, TAKE 1 TABLET BY MOUTH  DAILY, Disp: 90 tablet, Rfl: 3 .  Flaxseed, Linseed, (FLAXSEED OIL) 1200 MG CAPS, Take by mouth., Disp: , Rfl:  .  Garlic 1000 MG CAPS, Take by mouth., Disp: , Rfl:  .  losartan (COZAAR) 50 MG tablet, TAKE 1 TABLET BY MOUTH  DAILY, Disp: 90 tablet, Rfl: 3 .  metFORMIN  (GLUCOPHAGE) 1000 MG tablet, Take 1 tablet (1,000 mg total) by mouth 2 (two) times daily with a meal., Disp: 180 tablet, Rfl: 3 .  Misc Natural Products (DAILY HERBS PROSTATE PO), Take by mouth., Disp: , Rfl:  .  Omega-3 Fatty Acids (FISH OIL) 1000 MG CPDR, Take by mouth., Disp: , Rfl:  .  Omega-3 Fatty Acids (ULTRA OMEGA 3) 952 MG CAPS, Take by mouth., Disp: , Rfl:  .  ONETOUCH ULTRA test strip, TEST SUGAR ONCE DAILY, Disp: 100 each, Rfl: 3 .  Potassium Gluconate 550 MG TABS, Take by mouth., Disp: , Rfl:  .  Red Yeast Rice 600 MG TABS, Take by mouth., Disp: , Rfl:  .  sildenafil (VIAGRA) 50 MG tablet, Take 1 tablet (50 mg total) by mouth as needed for erectile dysfunction., Disp: 6 tablet, Rfl: 5 .  telmisartan (MICARDIS) 20 MG tablet, Take 1 tablet (20 mg total) by mouth daily., Disp: 90 tablet, Rfl: 3 .  vitamin E 1000 UNIT capsule, Take 1,000 Units by mouth daily., Disp: , Rfl:  .  Vitamins-Lipotropics (LIPO-FLAVONOID PLUS PO), Take by mouth., Disp: , Rfl:   Review of Systems  Constitutional: Positive for fever. Negative for activity change, appetite change, diaphoresis and fatigue.  HENT:  Positive for congestion and sore throat (Little bit per patient).   Eyes: Negative.   Respiratory: Positive for cough. Negative for shortness of breath and wheezing.   Cardiovascular: Negative for chest pain, palpitations and leg swelling.  Endocrine: Negative for cold intolerance, heat intolerance, polydipsia, polyphagia and polyuria.  Genitourinary: Negative.   Musculoskeletal: Negative for arthralgias.  Allergic/Immunologic: Negative.   Neurological: Negative.  Negative for dizziness, weakness, light-headedness and headaches.  Hematological: Negative.   Psychiatric/Behavioral: Negative.  Negative for agitation, self-injury, sleep disturbance and suicidal ideas. The patient is not nervous/anxious.     Social History   Tobacco Use  . Smoking status: Never Smoker  . Smokeless tobacco: Never Used   Substance Use Topics  . Alcohol use: No      Objective:   BP 136/87 (BP Location: Left Arm, Patient Position: Sitting, Cuff Size: Large)   Pulse 71   Temp (!) 97.3 F (36.3 C) (Other (Comment))   Resp 16   Ht 5\' 5"  (1.651 m)   Wt 179 lb (81.2 kg)   SpO2 96%   BMI 29.79 kg/m  Vitals:   03/11/19 1622  BP: 136/87  Pulse: 71  Resp: 16  Temp: (!) 97.3 F (36.3 C)  TempSrc: Other (Comment)  SpO2: 96%  Weight: 179 lb (81.2 kg)  Height: 5\' 5"  (1.651 m)  Body mass index is 29.79 kg/m.   Physical Exam Vitals signs reviewed.  Constitutional:      Appearance: Normal appearance. He is well-developed and normal weight.  HENT:     Head: Normocephalic and atraumatic.     Right Ear: Tympanic membrane and external ear normal.     Left Ear: Tympanic membrane and external ear normal.     Nose: Nose normal.     Mouth/Throat:     Pharynx: Oropharynx is clear.  Eyes:     Conjunctiva/sclera: Conjunctivae normal.     Pupils: Pupils are equal, round, and reactive to light.  Neck:     Musculoskeletal: Normal range of motion and neck supple.  Cardiovascular:     Rate and Rhythm: Normal rate and regular rhythm.     Heart sounds: Normal heart sounds.  Pulmonary:     Effort: Pulmonary effort is normal.     Breath sounds: Normal breath sounds.  Abdominal:     General: Bowel sounds are normal.     Palpations: Abdomen is soft.  Musculoskeletal: Normal range of motion.  Skin:    General: Skin is warm and dry.  Neurological:     General: No focal deficit present.     Mental Status: He is alert and oriented to person, place, and time. Mental status is at baseline.     Comments: Normal monofilament foot exam.  Psychiatric:        Mood and Affect: Mood normal.        Behavior: Behavior normal.        Thought Content: Thought content normal.        Judgment: Judgment normal.      Results for orders placed or performed in visit on 03/11/19  POCT glycosylated hemoglobin (Hb A1C)   Result Value Ref Range   Hemoglobin A1C 7.0 (A) 4.0 - 5.6 %   Est. average glucose Bld gHb Est-mCnc 154        Assessment & Plan     1. Type 2 diabetes mellitus without complication, without long-term current use of insulin (HCC) Patient on telmisartan to protect his kidneys. - POCT glycosylated  hemoglobin (Hb A1C)--7.0 controlled on Metformin  2. Need for influenza vaccination  - Flu Vaccine QUAD 36+ mos IM  3. Pure hypercholesterolemia On Zetia.  Patient statin intolerant  Follow up in February for CPE.     Richard Cranford Mon, MD  [facility]

## 2019-03-11 ENCOUNTER — Other Ambulatory Visit: Payer: Self-pay

## 2019-03-11 ENCOUNTER — Ambulatory Visit (INDEPENDENT_AMBULATORY_CARE_PROVIDER_SITE_OTHER): Payer: BC Managed Care – PPO | Admitting: Family Medicine

## 2019-03-11 ENCOUNTER — Encounter: Payer: Self-pay | Admitting: Family Medicine

## 2019-03-11 VITALS — BP 136/87 | HR 71 | Temp 97.3°F | Resp 16 | Ht 65.0 in | Wt 179.0 lb

## 2019-03-11 DIAGNOSIS — E7800 Pure hypercholesterolemia, unspecified: Secondary | ICD-10-CM

## 2019-03-11 DIAGNOSIS — E119 Type 2 diabetes mellitus without complications: Secondary | ICD-10-CM

## 2019-03-11 DIAGNOSIS — Z23 Encounter for immunization: Secondary | ICD-10-CM | POA: Diagnosis not present

## 2019-03-11 LAB — POCT GLYCOSYLATED HEMOGLOBIN (HGB A1C)
Est. average glucose Bld gHb Est-mCnc: 154
Hemoglobin A1C: 7 % — AB (ref 4.0–5.6)

## 2019-04-21 ENCOUNTER — Other Ambulatory Visit: Payer: Self-pay

## 2019-04-21 DIAGNOSIS — Z20822 Contact with and (suspected) exposure to covid-19: Secondary | ICD-10-CM

## 2019-04-23 ENCOUNTER — Telehealth: Payer: Self-pay

## 2019-04-23 LAB — NOVEL CORONAVIRUS, NAA: SARS-CoV-2, NAA: NOT DETECTED

## 2019-04-23 NOTE — Telephone Encounter (Signed)
Caller given negative result and verbalized undeerstanding

## 2019-04-25 ENCOUNTER — Other Ambulatory Visit: Payer: Self-pay

## 2019-04-25 DIAGNOSIS — Z20822 Contact with and (suspected) exposure to covid-19: Secondary | ICD-10-CM

## 2019-04-26 LAB — NOVEL CORONAVIRUS, NAA: SARS-CoV-2, NAA: DETECTED — AB

## 2019-05-02 ENCOUNTER — Other Ambulatory Visit: Payer: Self-pay

## 2019-05-02 ENCOUNTER — Ambulatory Visit: Payer: BC Managed Care – PPO | Attending: Internal Medicine

## 2019-05-02 ENCOUNTER — Other Ambulatory Visit: Payer: Self-pay | Admitting: Family Medicine

## 2019-05-02 DIAGNOSIS — Z20828 Contact with and (suspected) exposure to other viral communicable diseases: Secondary | ICD-10-CM | POA: Diagnosis not present

## 2019-05-02 DIAGNOSIS — Z20822 Contact with and (suspected) exposure to covid-19: Secondary | ICD-10-CM

## 2019-05-02 DIAGNOSIS — E119 Type 2 diabetes mellitus without complications: Secondary | ICD-10-CM

## 2019-05-04 LAB — NOVEL CORONAVIRUS, NAA: SARS-CoV-2, NAA: DETECTED — AB

## 2019-05-06 ENCOUNTER — Telehealth: Payer: Self-pay

## 2019-05-06 NOTE — Telephone Encounter (Signed)
Copied from [place]. Topic: General - Inquiry >> May 06, 2019 11:05 AM Scherrie Gerlach wrote: Reason for CRM: pt needs a note for work on Monday May 12, 2019.  Or the letter that states the CDC guidelines to return to work. Pt had another positive test on 05/02/2019. Pt would like a call back when this is done so he can pick up before Monday.

## 2019-05-06 NOTE — Telephone Encounter (Signed)
His first positive test was on 04/25/2019. The CDC guidelines recommend 10 days quarantine from date of first positive test or start of symptoms. There is no indication to retest and I am not sure why he did as you can test positive several weeks to months after infection. His quarantine is over on the 20 and he may return to work.Rene Paci written the note to return 05/06/2019.

## 2019-05-07 NOTE — Telephone Encounter (Signed)
No answer and no vm. Will try again later. Okay for PEC to give pt message.

## 2019-05-12 ENCOUNTER — Telehealth: Payer: Self-pay

## 2019-05-12 NOTE — Telephone Encounter (Signed)
Copied from [place] [id number]. Topic: General - Other >> May 12, 2019  9:29 AM Nils Flack wrote: Reason for CRM: pt needs letter to be able to return to work after covid 19 diagnosis.  He says he is fine and ready to go back to work.  He just wants a letter to be able to go back Please call [phone]

## 2019-05-12 NOTE — Telephone Encounter (Signed)
A note has been written for the patient to return to work on 05/06/2019 and he has picked it up from the clinic.

## 2019-05-12 NOTE — Telephone Encounter (Signed)
Ok to write letter. thx

## 2019-05-12 NOTE — Telephone Encounter (Signed)
Patient was advised and states that he will come pick up his letter.

## 2019-05-27 ENCOUNTER — Other Ambulatory Visit: Payer: Self-pay

## 2019-05-27 ENCOUNTER — Emergency Department
Admission: EM | Admit: 2019-05-27 | Discharge: 2019-05-27 | Disposition: A | Discharge status: Home / Self Care | Payer: BC Managed Care – PPO | Attending: Emergency Medicine | Admitting: Emergency Medicine

## 2019-05-27 DIAGNOSIS — Z7982 Long term (current) use of aspirin: Secondary | ICD-10-CM | POA: Insufficient documentation

## 2019-05-27 DIAGNOSIS — Z79899 Other long term (current) drug therapy: Secondary | ICD-10-CM | POA: Diagnosis not present

## 2019-05-27 DIAGNOSIS — I10 Essential (primary) hypertension: Secondary | ICD-10-CM | POA: Insufficient documentation

## 2019-05-27 DIAGNOSIS — F419 Anxiety disorder, unspecified: Secondary | ICD-10-CM | POA: Diagnosis not present

## 2019-05-27 DIAGNOSIS — E119 Type 2 diabetes mellitus without complications: Secondary | ICD-10-CM | POA: Diagnosis not present

## 2019-05-27 DIAGNOSIS — F410 Panic disorder [episodic paroxysmal anxiety] without agoraphobia: Secondary | ICD-10-CM | POA: Diagnosis not present

## 2019-05-27 DIAGNOSIS — Z7984 Long term (current) use of oral hypoglycemic drugs: Secondary | ICD-10-CM | POA: Diagnosis not present

## 2019-05-27 HISTORY — DX: Type 2 diabetes mellitus without complications: E11.9

## 2019-05-27 HISTORY — DX: Anxiety disorder, unspecified: F41.9

## 2019-05-27 HISTORY — DX: Hyperlipidemia, unspecified: E78.5

## 2019-05-27 HISTORY — DX: Essential (primary) hypertension: I10

## 2019-05-27 LAB — BASIC METABOLIC PANEL
Anion gap: 11 (ref 5–15)
BUN: 18 mg/dL (ref 8–23)
CO2: 24 mmol/L (ref 22–32)
Calcium: 9.2 mg/dL (ref 8.9–10.3)
Chloride: 101 mmol/L (ref 98–111)
Creatinine, Ser: 0.95 mg/dL (ref 0.61–1.24)
GFR calc Af Amer: >60 mL/min (ref >60)
GFR calc non Af Amer: >60 mL/min (ref >60)
Glucose, Bld: 215 mg/dL — ABNORMAL HIGH (ref 70–99)
Potassium: 4.5 mmol/L (ref 3.5–5.1)
Sodium: 136 mmol/L (ref 135–145)

## 2019-05-27 LAB — CBC
HCT: 39.3 % (ref 39.0–52.0)
Hemoglobin: 13.5 g/dL (ref 13.0–17.0)
MCH: 30.1 pg (ref 26.0–34.0)
MCHC: 34.4 g/dL (ref 30.0–36.0)
MCV: 87.7 fL (ref 80.0–100.0)
Platelets: 278 10*3/uL (ref 150–400)
RBC: 4.48 MIL/uL (ref 4.22–5.81)
RDW: 12.6 % (ref 11.5–15.5)
WBC: 8.4 10*3/uL (ref 4.0–10.5)
nRBC: 0 % (ref 0.0–0.2)

## 2019-05-27 MED ORDER — LORAZEPAM 0.5 MG PO TABS: 0.5000 mg | ORAL_TABLET | Freq: Three times a day (TID) | ORAL | 0 refills | Status: AC | PRN

## 2019-05-27 NOTE — ED Triage Notes (Signed)
Pt states he has been stressing about the guy his daughter is suppose to marry and became upset, had tingling in BL hands . States he has a hx of panic attacks and this felt the same states "I just want to be checked out".the patient denies having any sx at present.

## 2019-05-27 NOTE — ED Provider Notes (Signed)
[facility] Emergency Department Provider Note  ____________________________________________   First MD Initiated Contact with Patient 05/27/19 1851     (approximate)  I have reviewed the triage vital signs and the nursing notes.   HISTORY  Chief Complaint Anxiety    HPI Jason Glenn is a 59 y.o. male below list of previous medical conditions presents to the emergency department secondary to feelings of anxiety.  Patient states that he has a history of anxiety and felt as though he may have had a "panic attack".  Patient admits to come upset regarding the fact that his daughter is getting married.  Patient denies any chest pain no shortness of breath.  Patient denies any diaphoresis no nausea or vomiting.  Patient denies any complaints at present no longer feeling anxious.  Denies SI no HI        Past Medical History:  Diagnosis Date  . Anxiety   . Diabetes mellitus without complication (HCC)   . Hyperlipemia   . Hyperlipidemia   . Hypertension     Patient Active Problem List   Diagnosis Date Noted  . Anxiety 10/08/2014  . 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 HLD (hyperlipidemia) 10/08/2014  . Diabetes mellitus, type 2 (HCC) 10/08/2014    Past Surgical History:  Procedure Laterality Date  . POLYPECTOMY     tubular adenoma from colon    Prior to Admission medications   Medication Sig Start Date End Date Taking? Authorizing Provider  aspirin 81 MG tablet Take 81 mg by mouth daily.    [provider]  B Complex-C-Folic Acid (HM SUPER VITAMIN B COMPLEX/C PO) Take by mouth.    [provider]  Chromium-Cinnamon (CINNAMON PLUS CHROMIUM) [phone] MCG-MG CAPS Take by mouth.    [provider]  Coenzyme Q10 (CO Q-10) 100 MG CAPS Take by mouth.    [provider]  ezetimibe (ZETIA) 10 MG tablet TAKE 1 TABLET BY MOUTH  DAILY 10/05/18   Maple Hudson., MD  Flaxseed, Linseed, (FLAXSEED  OIL) 1200 MG CAPS Take by mouth.    [provider]  Garlic 1000 MG CAPS Take by mouth.    [provider]  losartan (COZAAR) 50 MG tablet TAKE 1 TABLET BY MOUTH  DAILY 05/02/19   Maple Hudson., MD  metFORMIN (GLUCOPHAGE) 1000 MG tablet Take 1 tablet (1,000 mg total) by mouth 2 (two) times daily with a meal. 08/05/18   Maple Hudson., MD  Misc Natural Products (DAILY HERBS PROSTATE PO) Take by mouth.    [provider]  Omega-3 Fatty Acids (FISH OIL) 1000 MG CPDR Take by mouth.    [provider]  Omega-3 Fatty Acids (ULTRA OMEGA 3) 952 MG CAPS Take by mouth.    [provider]  [facility] ULTRA test strip TEST SUGAR ONCE DAILY 10/05/18   Maple Hudson., MD  Potassium Gluconate 550 MG TABS Take by mouth.    [provider]  Red Yeast Rice 600 MG TABS Take by mouth.    [provider]  sildenafil (VIAGRA) 50 MG tablet Take 1 tablet (50 mg total) by mouth as needed for erectile dysfunction. 09/19/18   Maple Hudson., MD  telmisartan (MICARDIS) 20 MG tablet Take 1 tablet (20 mg total) by mouth daily. 04/22/18   Maple Hudson., MD  vitamin E 1000 UNIT capsule Take 1,000 Units by mouth daily.    [provider]  Vitamins-Lipotropics (LIPO-FLAVONOID PLUS PO) Take by mouth.    [provider]    Allergies Pravastatin sodium  Family History  Problem Relation Age of Onset  . Cancer Mother        type of salivary vs carotid gland cancer threated wuth chemotherapy  . CAD Father   . Leukemia Father     Social History Social History   Tobacco Use  . Smoking status: Never Smoker  . Smokeless tobacco: Never Used  Substance Use Topics  . Alcohol use: No  . Drug use: No    Review of Systems Constitutional: No fever/chills Eyes: No visual changes. ENT: No sore throat. Cardiovascular: Denies chest pain. Respiratory: Denies shortness of breath. Gastrointestinal: No abdominal  pain.  No nausea, no vomiting.  No diarrhea.  No constipation. Genitourinary: Negative for dysuria. Musculoskeletal: Negative for neck pain.  Negative for back pain. Integumentary: Negative for rash. Neurological: Negative for headaches, focal weakness or numbness. Psychiatric: Positive for feelings of anxiety   ____________________________________________   PHYSICAL EXAM:  VITAL SIGNS: ED Triage Vitals  Enc Vitals Group     BP 05/27/19 1756 (!) 174/91     Pulse Rate 05/27/19 1756 78     Resp 05/27/19 1756 18     Temp 05/27/19 1756 98 F (36.7 C)     Temp Source 05/27/19 1756 Oral     SpO2 05/27/19 1756 98 %     Weight 05/27/19 1757 77.1 kg (170 lb)     Height 05/27/19 1757 1.651 m (5\' 5" )     Head Circumference --      Peak Flow --      Pain Score 05/27/19 1757 0     Pain Loc --      Pain Edu? --      Excl. in GC? --     Constitutional: Alert and oriented.  Eyes: Conjunctivae are normal.  Mouth/Throat: Patient is wearing a mask. Neck: No stridor.  No meningeal signs.   Cardiovascular: Normal rate, regular rhythm. Good peripheral circulation. Grossly normal heart sounds. Respiratory: Normal respiratory effort.  No retractions. Gastrointestinal: Soft and nontender. No distention.  Musculoskeletal: No lower extremity tenderness nor edema. No gross deformities of extremities. Neurologic:  Normal speech and language. No gross focal neurologic deficits are appreciated.  Skin:  Skin is warm, dry and intact. Psychiatric: Mood and affect are normal. Speech and behavior are normal.  ____________________________________________   LABS (all labs ordered are listed, but only abnormal results are displayed)  Labs Reviewed  BASIC METABOLIC PANEL - Abnormal; Notable for the following components:      Result Value   Glucose, Bld 215 (*)    All other components within normal limits  CBC   ____________________________________________  EKG  ED ECG REPORT I, Shidler N Rohin Krejci,  the attending physician, personally viewed and interpreted this ECG.   Date: 05/27/2019  EKG Time: 7:09 PM  Rate: 69  Rhythm: Normal sinus rhythm  Axis: Normal  Intervals: Normal  ST&T Change: None   Procedures   ____________________________________________   INITIAL IMPRESSION / MDM / ASSESSMENT AND PLAN / ED COURSE  As part of my medical decision making, I reviewed the following data within the electronic MEDICAL RECORD NUMBER   59 year old male presented with above-stated history and physical exam consistent with panic attack.  Patient without any symptoms at present.  Advised patient to follow-up with primary care provider regarding treatment for anxiety.     ____________________________________________  FINAL CLINICAL  IMPRESSION(S) / ED DIAGNOSES  Final diagnoses:  Panic attack     MEDICATIONS GIVEN DURING THIS VISIT:  Medications - No data to display   ED Discharge Orders    None      *Please note:  Jason Glenn was evaluated in Emergency Department on 05/27/2019 for the symptoms described in the history of present illness. He was evaluated in the context of the global COVID-19 pandemic, which necessitated consideration that the patient might be at risk for infection with the SARS-CoV-2 virus that causes COVID-19. Institutional protocols and algorithms that pertain to the evaluation of patients at risk for COVID-19 are in a state of rapid change based on information released by regulatory bodies including the CDC and federal and state organizations. These policies and algorithms were followed during the patient's care in the ED.  Some ED evaluations and interventions may be delayed as a result of limited staffing during the pandemic.*  Note:  This document was prepared using Dragon voice recognition software and may include unintentional dictation errors.   Gregor Hams, MD 05/27/19 1919

## 2019-06-06 NOTE — Progress Notes (Signed)
Patient: Jason Glenn, Male    DOB: 1961/01/17, 59 y.o.   MRN: [id number] Visit Date: 06/11/2019  Today's Provider: [name], MD   Chief Complaint  Patient presents with  . Annual Exam   Subjective:     Annual physical exam Jason Glenn is a 59 y.o. male who presents today for health maintenance and complete physical. He feels fairly well. He reports exercising as walking at work. He reports he is sleeping well. Some recent anxiety. -----------------------------------------------------------------  Colonoscopy: 03/23/2015  Review of Systems  Constitutional: Negative for activity change, appetite change, diaphoresis and fatigue.  HENT: Negative.   Eyes: Negative.   Respiratory: Negative for cough and shortness of breath.   Cardiovascular: Negative for chest pain, palpitations and leg swelling.  Gastrointestinal: Negative.   Endocrine: Negative for cold intolerance, heat intolerance, polydipsia, polyphagia and polyuria.  Musculoskeletal: Negative for arthralgias.  Allergic/Immunologic: Negative.   Neurological: Negative for dizziness, light-headedness and headaches.  Psychiatric/Behavioral: Negative for agitation, self-injury, sleep disturbance and suicidal ideas. The patient is not nervous/anxious.     Social History      He  reports that he has never smoked. He has never used smokeless tobacco. He reports that he does not drink alcohol or use drugs.       Social History   Socioeconomic History  . Marital status: Married    Spouse name: Not on file  . Number of children: Not on file  . Years of education: Not on file  . Highest education level: Not on file  Occupational History  . Not on file  Tobacco Use  . Smoking status: Never Smoker  . Smokeless tobacco: Never Used  Substance and Sexual Activity  . Alcohol use: No  . Drug use: No  . Sexual activity: Yes  Other Topics Concern  . Not on file  Social History Narrative  . Not on file    Social Determinants of Health   Financial Resource Strain:   . Difficulty of Paying Living Expenses: Not on file  Food Insecurity:   . Worried About Programme researcher, broadcasting/film/video in the Last Year: Not on file  . Ran Out of Food in the Last Year: Not on file  Transportation Needs:   . Lack of Transportation (Medical): Not on file  . Lack of Transportation (Non-Medical): Not on file  Physical Activity:   . Days of Exercise per Week: Not on file  . Minutes of Exercise per Session: Not on file  Stress:   . Feeling of Stress : Not on file  Social Connections:   . Frequency of Communication with Friends and Family: Not on file  . Frequency of Social Gatherings with Friends and Family: Not on file  . Attends Religious Services: Not on file  . Active Member of Clubs or Organizations: Not on file  . Attends Banker Meetings: Not on file  . Marital Status: Not on file    Past Medical History:  Diagnosis Date  . Anxiety   . Diabetes mellitus without complication (HCC)   . Hyperlipemia   . Hyperlipidemia   . Hypertension      Patient Active Problem List   Diagnosis Date Noted  . Anxiety 10/08/2014  . 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 HLD (hyperlipidemia) 10/08/2014  . Diabetes mellitus, type 2 (HCC) 10/08/2014    Past Surgical History:  Procedure Laterality Date  . POLYPECTOMY     tubular  adenoma from colon    Family History        Family Status  Relation Name Status  . Mother  Alive  . Father  Deceased at age 44  . Sister  Alive  . Brother  Alive  . Daughter  Alive  . Son  Alive  . Sister  Alive        His family history includes CAD in his father; Cancer in his mother; Leukemia in his father.      Allergies  Allergen Reactions  . Pravastatin Sodium     Other reaction(s): Muscle Cramps     Current Outpatient Medications:  .  aspirin 81 MG tablet, Take 81 mg by mouth daily., Disp: , Rfl:  .  B Complex-C-Folic Acid (HM SUPER  VITAMIN B COMPLEX/C PO), Take by mouth., Disp: , Rfl:  .  Chromium-Cinnamon (CINNAMON PLUS CHROMIUM) [phone] MCG-MG CAPS, Take by mouth., Disp: , Rfl:  .  Coenzyme Q10 (CO Q-10) 100 MG CAPS, Take by mouth., Disp: , Rfl:  .  ezetimibe (ZETIA) 10 MG tablet, TAKE 1 TABLET BY MOUTH  DAILY, Disp: 90 tablet, Rfl: 3 .  Flaxseed, Linseed, (FLAXSEED OIL) 1200 MG CAPS, Take by mouth., Disp: , Rfl:  .  Garlic 2595 MG CAPS, Take by mouth., Disp: , Rfl:  .  LORazepam (ATIVAN) 0.5 MG tablet, Take 1 tablet (0.5 mg total) by mouth every 8 (eight) hours as needed for anxiety., Disp: 4 tablet, Rfl: 0 .  losartan (COZAAR) 50 MG tablet, TAKE 1 TABLET BY MOUTH  DAILY, Disp: 20 tablet, Rfl: 1 .  metFORMIN (GLUCOPHAGE) 1000 MG tablet, Take 1 tablet (1,000 mg total) by mouth 2 (two) times daily with a meal., Disp: 180 tablet, Rfl: 3 .  Misc Natural Products (DAILY HERBS PROSTATE PO), Take by mouth., Disp: , Rfl:  .  Omega-3 Fatty Acids (FISH OIL) 1000 MG CPDR, Take by mouth., Disp: , Rfl:  .  Omega-3 Fatty Acids (ULTRA OMEGA 3) 952 MG CAPS, Take by mouth., Disp: , Rfl:  .  ONETOUCH ULTRA test strip, TEST SUGAR ONCE DAILY, Disp: 100 each, Rfl: 3 .  Potassium Gluconate 550 MG TABS, Take by mouth., Disp: , Rfl:  .  Red Yeast Rice 600 MG TABS, Take by mouth., Disp: , Rfl:  .  sildenafil (VIAGRA) 50 MG tablet, Take 1 tablet (50 mg total) by mouth as needed for erectile dysfunction., Disp: 6 tablet, Rfl: 5 .  telmisartan (MICARDIS) 20 MG tablet, Take 1 tablet (20 mg total) by mouth daily., Disp: 90 tablet, Rfl: 3 .  vitamin E 1000 UNIT capsule, Take 1,000 Units by mouth daily., Disp: , Rfl:  .  Vitamins-Lipotropics (LIPO-FLAVONOID PLUS PO), Take by mouth., Disp: , Rfl:    Patient Care Team: Jerrol Banana., MD as PCP - General (Family Medicine)    Objective:    Vitals: There were no vitals taken for this visit.  There were no vitals filed for this visit.   Physical Exam Vitals reviewed.  Constitutional:       Appearance: Normal appearance. He is well-developed and normal weight.  HENT:     Head: Normocephalic and atraumatic.     Right Ear: Tympanic membrane and external ear normal.     Left Ear: Tympanic membrane and external ear normal.     Nose: Nose normal.     Mouth/Throat:     Pharynx: Oropharynx is clear.  Eyes:     Conjunctiva/sclera: Conjunctivae normal.  Pupils: Pupils are equal, round, and reactive to light.  Cardiovascular:     Rate and Rhythm: Normal rate and regular rhythm.     Heart sounds: Normal heart sounds.  Pulmonary:     Effort: Pulmonary effort is normal.     Breath sounds: Normal breath sounds.  Abdominal:     General: Bowel sounds are normal.     Palpations: Abdomen is soft.  Genitourinary:    Penis: Normal.      Testes: Normal.     Prostate: Normal.     Rectum: Normal.  Musculoskeletal:        General: Normal range of motion.     Cervical back: Normal range of motion and neck supple.  Skin:    General: Skin is warm and dry.  Neurological:     General: No focal deficit present.     Mental Status: He is alert and oriented to person, place, and time. Mental status is at baseline.  Psychiatric:        Mood and Affect: Mood normal.        Behavior: Behavior normal.        Thought Content: Thought content normal.        Judgment: Judgment normal.      Depression Screen PHQ 2/9 Scores 02/14/2018 10/15/2017 08/08/2016 12/24/2014  PHQ - 2 Score 0 0 0 0  PHQ- 9 Score - - 1 -       Assessment & Plan:     Routine Health Maintenance and Physical Exam  Exercise Activities and Dietary recommendations Goals   None     Immunization History  Administered Date(s) Administered  . Hepatitis B, adult 08/08/2016  . Influenza,inj,Quad PF,6+ Mos 02/14/2018, 03/11/2019  . Pneumococcal Polysaccharide-23 08/08/2016  . Td 12/02/2003, 12/09/2012    Health Maintenance  Topic Date Due  . FOOT EXAM  05/17/1968  . HIV Screening  05/17/1973  . HEMOGLOBIN A1C   09/09/2019  . OPHTHALMOLOGY EXAM  12/02/2019  . TETANUS/TDAP  12/10/2022  . COLONOSCOPY  03/22/2025  . INFLUENZA VACCINE  Completed  . PNEUMOCOCCAL POLYSACCHARIDE VACCINE AGE 38-64 HIGH RISK  Completed  . Hepatitis C Screening  Completed     Discussed health benefits of physical activity, and encouraged him to engage in regular exercise appropriate for his age and condition.   1. Annual physical exam  - CBC with Differential/Platelet - Comprehensive metabolic panel - TSH - Lipid panel - PSA - POCT UA - Microalbumin - POCT urinalysis dipstick  2. Type 2 diabetes mellitus without complication, without long-term current use of insulin (HCC)  - Hemoglobin A1c - POCT UA - Microalbumin - POCT urinalysis dipstick  3. ED (erectile dysfunction) of organic origin   4. Pure hypercholesterolemia   5. Essential hypertension   --------------------------------------------------------------------    [name], MD  [facility]

## 2019-06-11 ENCOUNTER — Other Ambulatory Visit: Payer: Self-pay

## 2019-06-11 ENCOUNTER — Encounter: Payer: Self-pay | Admitting: Family Medicine

## 2019-06-11 ENCOUNTER — Ambulatory Visit (INDEPENDENT_AMBULATORY_CARE_PROVIDER_SITE_OTHER): Payer: BC Managed Care – PPO | Admitting: Family Medicine

## 2019-06-11 VITALS — BP 120/84 | HR 74 | Temp 96.9°F | Resp 97 | Wt 173.0 lb

## 2019-06-11 DIAGNOSIS — Z0000 Encounter for general adult medical examination without abnormal findings: Secondary | ICD-10-CM | POA: Diagnosis not present

## 2019-06-11 DIAGNOSIS — I10 Essential (primary) hypertension: Secondary | ICD-10-CM

## 2019-06-11 DIAGNOSIS — N529 Male erectile dysfunction, unspecified: Secondary | ICD-10-CM

## 2019-06-11 DIAGNOSIS — E119 Type 2 diabetes mellitus without complications: Secondary | ICD-10-CM

## 2019-06-11 DIAGNOSIS — E7800 Pure hypercholesterolemia, unspecified: Secondary | ICD-10-CM | POA: Diagnosis not present

## 2019-06-11 LAB — POCT URINALYSIS DIPSTICK
Bilirubin, UA: NEGATIVE
Blood, UA: NEGATIVE
Glucose, UA: NEGATIVE
Leukocytes, UA: NEGATIVE
Nitrite, UA: NEGATIVE
Protein, UA: NEGATIVE
Spec Grav, UA: 1.025 (ref 1.010–1.025)
Urobilinogen, UA: 0.2 E.U./dL
pH, UA: 5 (ref 5.0–8.0)

## 2019-06-11 LAB — POCT UA - MICROALBUMIN: Microalbumin Ur, POC: NEGATIVE mg/L

## 2019-06-25 DIAGNOSIS — Z0000 Encounter for general adult medical examination without abnormal findings: Secondary | ICD-10-CM | POA: Diagnosis not present

## 2019-06-25 DIAGNOSIS — E119 Type 2 diabetes mellitus without complications: Secondary | ICD-10-CM | POA: Diagnosis not present

## 2019-06-26 LAB — HEMOGLOBIN A1C
Est. average glucose Bld gHb Est-mCnc: 169 mg/dL
Hgb A1c MFr Bld: 7.5 % — ABNORMAL HIGH (ref 4.8–5.6)

## 2019-06-26 LAB — COMPREHENSIVE METABOLIC PANEL
ALT: 43 IU/L (ref 0–44)
AST: 21 IU/L (ref 0–40)
Albumin/Globulin Ratio: 2 (ref 1.2–2.2)
Albumin: 4.7 g/dL (ref 3.8–4.8)
Alkaline Phosphatase: 70 IU/L (ref 39–117)
BUN/Creatinine Ratio: 23 (ref 10–24)
BUN: 19 mg/dL (ref 8–27)
Bilirubin Total: 0.5 mg/dL (ref 0.0–1.2)
CO2: 24 mmol/L (ref 20–29)
Calcium: 9.2 mg/dL (ref 8.6–10.2)
Chloride: 99 mmol/L (ref 96–106)
Creatinine, Ser: 0.84 mg/dL (ref 0.76–1.27)
GFR calc Af Amer: 109 mL/min/{1.73_m2} (ref >59)
GFR calc non Af Amer: 95 mL/min/{1.73_m2} (ref >59)
Globulin, Total: 2.3 g/dL (ref 1.5–4.5)
Glucose: 145 mg/dL — ABNORMAL HIGH (ref 65–99)
Potassium: 4.6 mmol/L (ref 3.5–5.2)
Sodium: 139 mmol/L (ref 134–144)
Total Protein: 7 g/dL (ref 6.0–8.5)

## 2019-06-26 LAB — LIPID PANEL
Chol/HDL Ratio: 4.6 ratio (ref 0.0–5.0)
Cholesterol, Total: 211 mg/dL — ABNORMAL HIGH (ref 100–199)
HDL: 46 mg/dL (ref >39)
LDL Chol Calc (NIH): 132 mg/dL — ABNORMAL HIGH (ref 0–99)
Triglycerides: 183 mg/dL — ABNORMAL HIGH (ref 0–149)
VLDL Cholesterol Cal: 33 mg/dL (ref 5–40)

## 2019-06-26 LAB — CBC WITH DIFFERENTIAL/PLATELET
Basophils Absolute: 0.1 10*3/uL (ref 0.0–0.2)
Basos: 1 %
EOS (ABSOLUTE): 0.3 10*3/uL (ref 0.0–0.4)
Eos: 4 %
Hematocrit: 39.6 % (ref 37.5–51.0)
Hemoglobin: 13.4 g/dL (ref 13.0–17.7)
Immature Grans (Abs): 0 10*3/uL (ref 0.0–0.1)
Immature Granulocytes: 0 %
Lymphocytes Absolute: 1.9 10*3/uL (ref 0.7–3.1)
Lymphs: 29 %
MCH: 30.8 pg (ref 26.6–33.0)
MCHC: 33.8 g/dL (ref 31.5–35.7)
MCV: 91 fL (ref 79–97)
Monocytes Absolute: 0.4 10*3/uL (ref 0.1–0.9)
Monocytes: 7 %
Neutrophils Absolute: 3.8 10*3/uL (ref 1.4–7.0)
Neutrophils: 59 %
Platelets: 344 10*3/uL (ref 150–450)
RBC: 4.35 x10E6/uL (ref 4.14–5.80)
RDW: 13.1 % (ref 11.6–15.4)
WBC: 6.5 10*3/uL (ref 3.4–10.8)

## 2019-06-26 LAB — PSA: Prostate Specific Ag, Serum: 0.5 ng/mL (ref 0.0–4.0)

## 2019-06-26 LAB — TSH: TSH: 2.1 u[IU]/mL (ref 0.450–4.500)

## 2019-09-03 ENCOUNTER — Other Ambulatory Visit: Payer: Self-pay | Admitting: Family Medicine

## 2019-09-03 DIAGNOSIS — E119 Type 2 diabetes mellitus without complications: Secondary | ICD-10-CM

## 2019-09-03 NOTE — Telephone Encounter (Signed)
Requested Prescriptions  Pending Prescriptions Disp Refills  . metFORMIN (GLUCOPHAGE) 1000 MG tablet [Pharmacy Med Name: MetFORMIN 1000MG TABLET] 180 tablet 1    Sig: TAKE 1 TABLET BY MOUTH  TWICE DAILY WITH A MEAL     Endocrinology:  Diabetes - Biguanides Passed - 09/03/2019  6:17 AM      Passed - Cr in normal range and within 360 days    Creatinine, Ser  Date Value Ref Range Status  06/25/2019 0.84 0.76 - 1.27 mg/dL Final   Creatinine, POC  Date Value Ref Range Status  02/14/2018 NA mg/dL Final         Passed - HBA1C is between 0 and 7.9 and within 180 days    Hgb A1c MFr Bld  Date Value Ref Range Status  06/25/2019 7.5 (H) 4.8 - 5.6 % Final    Comment:             Prediabetes: 5.7 - 6.4          Diabetes: >6.4          Glycemic control for adults with diabetes: <7.0          Passed - eGFR in normal range and within 360 days    GFR calc Af Amer  Date Value Ref Range Status  06/25/2019 109 >59 mL/min/1.73 Final   GFR calc non Af Amer  Date Value Ref Range Status  06/25/2019 95 >59 mL/min/1.73 Final         Passed - Valid encounter within last 6 months    Recent Outpatient Visits          2 months ago Annual physical exam   [facility] Jerrol Banana., MD   5 months ago Type 2 diabetes mellitus without complication, without long-term current use of insulin [facility])   [facility] Jerrol Banana., MD   10 months ago Type 2 diabetes mellitus without complication, without long-term current use of insulin [facility])   [facility] Jerrol Banana., MD   1 year ago Type 2 diabetes mellitus without complication, without long-term current use of insulin [facility])   [facility] Jerrol Banana., MD   1 year ago Pure hypercholesterolemia   [facility] Jerrol Banana., MD      Future Appointments            In 1 month Jerrol Banana., MD [facility] [place]

## 2019-10-03 NOTE — Progress Notes (Signed)
Trena Platt Ariell Gunnels,acting as a scribe for Wilhemena Durie, MD.,have documented all relevant documentation on the behalf of Wilhemena Durie, MD,as directed by  Wilhemena Durie, MD while in the presence of Wilhemena Durie, MD.  Established patient visit   Patient: Jason Glenn [facility]   DOB: [birth year]   59 y.o. Male  MRN: [id number] Visit Date: 10/09/2019  Today's healthcare provider: Wilhemena Durie, MD   Chief Complaint  Patient presents with  . Diabetes Mellitus  . Hypertension   Subjective    HPI  Overall patient doing fairly well.  In review of issues he has been intolerant to statins with significant myalgias when he took them for his lipids Diabetes Mellitus Type II, follow-up  Lab Results  Component Value Date   HGBA1C 7.5 (H) 06/25/2019   HGBA1C 7.0 (A) 03/11/2019   HGBA1C 7.5 (A) 11/06/2018   Last seen for diabetes 4 months ago.  Management since then includes continuing the same treatment. He reports excellent compliance with treatment. He is not having side effects.   Home blood sugar records: fasting range: 165  Episodes of hypoglycemia? No    Current insulin regiment: none  Most Recent Eye Exam: due for eye exam  --------------------------------------------------------------------------------------------------- Hypertension, follow-up  BP Readings from Last 3 Encounters:  10/09/19 126/83  06/11/19 120/84  05/27/19 (!) 165/89   Wt Readings from Last 3 Encounters:  10/09/19 179 lb 3.2 oz (81.3 kg)  06/11/19 173 lb (78.5 kg)  05/27/19 170 lb (77.1 kg)     He was last seen for hypertension 4 months ago.  BP at that visit was 165/89. Management since that visit includes; no changes. He reports excellent compliance with treatment. He is not having side effects.  He is not exercising. He is adherent to low salt diet.   Outside blood pressures are not checked.  He does not smoke.  Use of agents associated with hypertension: NSAIDS.    He requests refill of Viagra for ED. ---------------------------------------------------------------------------------------------------   Social History   Tobacco Use  . Smoking status: Never Smoker  . Smokeless tobacco: Never Used  Substance Use Topics  . Alcohol use: No  . Drug use: No       Medications: Outpatient Medications Prior to Visit  Medication Sig  . aspirin 81 MG tablet Take 81 mg by mouth daily.  . B Complex-C-Folic Acid (HM SUPER VITAMIN B COMPLEX/C PO) Take by mouth.  . Chromium-Cinnamon (CINNAMON PLUS CHROMIUM) [phone] MCG-MG CAPS Take by mouth.  . Coenzyme Q10 (CO Q-10) 100 MG CAPS Take by mouth.  . ezetimibe (ZETIA) 10 MG tablet TAKE 1 TABLET BY MOUTH  DAILY  . Flaxseed, Linseed, (FLAXSEED OIL) 1200 MG CAPS Take by mouth.  . Garlic 7829 MG CAPS Take by mouth.  Marland Kitchen LORazepam (ATIVAN) 0.5 MG tablet Take 1 tablet (0.5 mg total) by mouth every 8 (eight) hours as needed for anxiety.  Marland Kitchen losartan (COZAAR) 50 MG tablet TAKE 1 TABLET BY MOUTH  DAILY  . metFORMIN (GLUCOPHAGE) 1000 MG tablet TAKE 1 TABLET BY MOUTH  TWICE DAILY WITH A MEAL  . Misc Natural Products (DAILY HERBS PROSTATE PO) Take by mouth.  . Omega-3 Fatty Acids (FISH OIL) 1000 MG CPDR Take by mouth.  . Omega-3 Fatty Acids (ULTRA OMEGA 3) 952 MG CAPS Take by mouth.  Glory Rosebush ULTRA test strip TEST SUGAR ONCE DAILY  . Potassium Gluconate 550 MG TABS Take by mouth.  . Red Yeast Rice 600 MG  TABS Take by mouth.  . sildenafil (VIAGRA) 50 MG tablet Take 1 tablet (50 mg total) by mouth as needed for erectile dysfunction.  Marland Kitchen telmisartan (MICARDIS) 20 MG tablet Take 1 tablet (20 mg total) by mouth daily.  . vitamin E 1000 UNIT capsule Take 1,000 Units by mouth daily.  . Vitamins-Lipotropics (LIPO-FLAVONOID PLUS PO) Take by mouth.   No facility-administered medications prior to visit.    Review of Systems  Constitutional: Negative for appetite change, chills and fever.  Eyes: Negative.   Respiratory:  Negative for chest tightness, shortness of breath and wheezing.   Cardiovascular: Negative for chest pain and palpitations.  Gastrointestinal: Negative for abdominal pain, nausea and vomiting.  Genitourinary:       ED.  Musculoskeletal: Negative.   Allergic/Immunologic: Negative.   Hematological: Negative.   Psychiatric/Behavioral: Negative.        Objective    BP 126/83 (BP Location: Right Arm, Patient Position: Sitting, Cuff Size: Large)   Pulse 63   Temp (!) 96.8 F (36 C) (Temporal)   Ht 5\' 5"  (1.651 m)   Wt 179 lb 3.2 oz (81.3 kg)   BMI 29.82 kg/m  BP Readings from Last 3 Encounters:  10/09/19 126/83  06/11/19 120/84  05/27/19 (!) 165/89   Wt Readings from Last 3 Encounters:  10/09/19 179 lb 3.2 oz (81.3 kg)  06/11/19 173 lb (78.5 kg)  05/27/19 170 lb (77.1 kg)      Physical Exam Vitals reviewed.  Constitutional:      Appearance: Normal appearance. He is well-developed and normal weight.  HENT:     Head: Normocephalic and atraumatic.     Right Ear: Tympanic membrane and external ear normal.     Left Ear: Tympanic membrane and external ear normal.     Nose: Nose normal.     Mouth/Throat:     Pharynx: Oropharynx is clear.  Eyes:     Conjunctiva/sclera: Conjunctivae normal.     Pupils: Pupils are equal, round, and reactive to light.  Cardiovascular:     Rate and Rhythm: Normal rate and regular rhythm.     Heart sounds: Normal heart sounds.  Pulmonary:     Effort: Pulmonary effort is normal.     Breath sounds: Normal breath sounds.  Abdominal:     General: Bowel sounds are normal.     Palpations: Abdomen is soft.  Genitourinary:    Penis: Normal.      Testes: Normal.     Prostate: Normal.     Rectum: Normal.  Musculoskeletal:        General: Normal range of motion.     Cervical back: Normal range of motion and neck supple.  Skin:    General: Skin is warm and dry.  Neurological:     General: No focal deficit present.     Mental Status: He is alert  and oriented to person, place, and time. Mental status is at baseline.  Psychiatric:        Mood and Affect: Mood normal.        Behavior: Behavior normal.        Thought Content: Thought content normal.        Judgment: Judgment normal.       No results found for any visits on 10/09/19.  Assessment & Plan    1. Type 2 diabetes mellitus without complication, without long-term current use of insulin (HCC) A1c is 7.5 today.  Patient to work on diet and exercise.  Might  need to add pioglitazone on next visit. - POCT HgB A1C - glucose blood (CONTOUR NEXT TEST) test strip; Use as instructed  Dispense: 100 each; Refill: 12 - Lancets MISC; Use as directed.  Dispense: 100 each; Refill: 12  2. Essential hypertension Good control on losartan  3. ED (erectile dysfunction) of organic origin Refills of Viagra - sildenafil (VIAGRA) 50 MG tablet; Take 1 tablet (50 mg total) by mouth as needed for erectile dysfunction.  Dispense: 6 tablet; Refill: 5  4. Pure hypercholesterolemia Patient on Zetia.  Intolerant of statins.     I, [name], MD, have reviewed all documentation for this visit. The documentation on 10/12/19 for the exam, diagnosis, procedures, and orders are all accurate and complete.   No follow-ups on file.         Richard Wendelyn Breslow, MD  [facility] [phone] (phone) [phone] (fax)  [facility]

## 2019-10-09 ENCOUNTER — Other Ambulatory Visit: Payer: Self-pay

## 2019-10-09 ENCOUNTER — Ambulatory Visit: Payer: BC Managed Care – PPO | Admitting: Family Medicine

## 2019-10-09 ENCOUNTER — Encounter: Payer: Self-pay | Admitting: Family Medicine

## 2019-10-09 VITALS — BP 126/83 | HR 63 | Temp 96.8°F | Ht 65.0 in | Wt 179.2 lb

## 2019-10-09 DIAGNOSIS — E119 Type 2 diabetes mellitus without complications: Secondary | ICD-10-CM | POA: Diagnosis not present

## 2019-10-09 DIAGNOSIS — I10 Essential (primary) hypertension: Secondary | ICD-10-CM | POA: Diagnosis not present

## 2019-10-09 DIAGNOSIS — E7800 Pure hypercholesterolemia, unspecified: Secondary | ICD-10-CM | POA: Diagnosis not present

## 2019-10-09 DIAGNOSIS — N529 Male erectile dysfunction, unspecified: Secondary | ICD-10-CM

## 2019-10-09 LAB — POCT GLYCOSYLATED HEMOGLOBIN (HGB A1C)
Estimated Average Glucose: 169
Hemoglobin A1C: 7.5 % — AB (ref 4.0–5.6)

## 2019-10-09 MED ORDER — SILDENAFIL CITRATE 50 MG PO TABS: 50.0000 mg | ORAL_TABLET | ORAL | 5 refills | Status: DC | PRN

## 2019-10-09 MED ORDER — CONTOUR NEXT TEST VI STRP: ORAL_STRIP | 12 refills | Status: DC

## 2019-10-09 MED ORDER — LANCETS MISC: 12 refills | Status: DC

## 2019-10-15 ENCOUNTER — Other Ambulatory Visit: Payer: Self-pay

## 2019-10-15 DIAGNOSIS — E119 Type 2 diabetes mellitus without complications: Secondary | ICD-10-CM

## 2019-10-15 MED ORDER — MICROLET LANCETS MISC: 12 refills | Status: DC

## 2019-10-15 MED ORDER — CONTOUR NEXT TEST VI STRP: ORAL_STRIP | 12 refills | Status: DC

## 2019-10-17 ENCOUNTER — Other Ambulatory Visit: Payer: Self-pay | Admitting: Family Medicine

## 2019-10-17 DIAGNOSIS — E119 Type 2 diabetes mellitus without complications: Secondary | ICD-10-CM

## 2019-10-17 DIAGNOSIS — E7849 Other hyperlipidemia: Secondary | ICD-10-CM

## 2019-10-17 NOTE — Telephone Encounter (Signed)
Requested medication (s) are due for refill today: Yes  Requested medication (s) are on the active medication list: Yes  Last refill:  10/05/18  Future visit scheduled: Yes  Notes to clinic:  Prescription has expired.    Requested Prescriptions  Pending Prescriptions Disp Refills   ezetimibe (ZETIA) 10 MG tablet [Pharmacy Med Name: EZETIMIBE  10MG   TAB] 90 tablet 3    Sig: TAKE 1 TABLET BY MOUTH  DAILY      Cardiovascular:  Antilipid - Sterol Transport Inhibitors Failed - 10/17/2019 12:39 PM      Failed - Total Cholesterol in normal range and within 360 days    Cholesterol, Total  Date Value Ref Range Status  06/25/2019 211 (H) 100 - 199 mg/dL Final          Failed - LDL in normal range and within 360 days    LDL Chol Calc (NIH)  Date Value Ref Range Status  06/25/2019 132 (H) 0 - 99 mg/dL Final          Failed - Triglycerides in normal range and within 360 days    Triglycerides  Date Value Ref Range Status  06/25/2019 183 (H) 0 - 149 mg/dL Final          Passed - HDL in normal range and within 360 days    HDL  Date Value Ref Range Status  06/25/2019 46 >39 mg/dL Final          Passed - Valid encounter within last 12 months    Recent Outpatient Visits           1 week ago Type 2 diabetes mellitus without complication, without long-term current use of insulin [facility])   [facility] [place]   4 months ago Annual physical exam   [facility] [place]   7 months ago Type 2 diabetes mellitus without complication, without long-term current use of insulin [facility])   [facility] MD   11 months ago Type 2 diabetes mellitus without complication, without long-term current use of insulin [facility])   [facility] MD   1 year ago Type 2 diabetes mellitus without complication, without long-term current use of insulin [facility])   [facility] MD       Future Appointments             In 3 months Maple Hudson., MD [place], [facility]

## 2019-11-21 ENCOUNTER — Other Ambulatory Visit: Payer: Self-pay

## 2019-11-21 DIAGNOSIS — E119 Type 2 diabetes mellitus without complications: Secondary | ICD-10-CM

## 2019-11-21 MED ORDER — MICROLET LANCETS MISC: 12 refills | Status: DC

## 2020-01-22 ENCOUNTER — Other Ambulatory Visit: Payer: Self-pay | Admitting: Family Medicine

## 2020-01-22 DIAGNOSIS — E119 Type 2 diabetes mellitus without complications: Secondary | ICD-10-CM

## 2020-02-10 DIAGNOSIS — D225 Melanocytic nevi of trunk: Secondary | ICD-10-CM | POA: Diagnosis not present

## 2020-02-10 DIAGNOSIS — L820 Inflamed seborrheic keratosis: Secondary | ICD-10-CM | POA: Diagnosis not present

## 2020-02-10 DIAGNOSIS — X32XXXA Exposure to sunlight, initial encounter: Secondary | ICD-10-CM | POA: Diagnosis not present

## 2020-02-10 DIAGNOSIS — D2261 Melanocytic nevi of right upper limb, including shoulder: Secondary | ICD-10-CM | POA: Diagnosis not present

## 2020-02-10 DIAGNOSIS — D485 Neoplasm of uncertain behavior of skin: Secondary | ICD-10-CM | POA: Diagnosis not present

## 2020-02-10 DIAGNOSIS — D2262 Melanocytic nevi of left upper limb, including shoulder: Secondary | ICD-10-CM | POA: Diagnosis not present

## 2020-02-10 DIAGNOSIS — Z85828 Personal history of other malignant neoplasm of skin: Secondary | ICD-10-CM | POA: Diagnosis not present

## 2020-02-10 DIAGNOSIS — L570 Actinic keratosis: Secondary | ICD-10-CM | POA: Diagnosis not present

## 2020-02-11 ENCOUNTER — Encounter: Payer: Self-pay | Admitting: Family Medicine

## 2020-02-11 ENCOUNTER — Ambulatory Visit (INDEPENDENT_AMBULATORY_CARE_PROVIDER_SITE_OTHER): Payer: BC Managed Care – PPO | Admitting: Family Medicine

## 2020-02-11 ENCOUNTER — Other Ambulatory Visit: Payer: Self-pay

## 2020-02-11 VITALS — BP 143/71 | HR 62 | Temp 98.1°F | Resp 16 | Ht 65.0 in | Wt 176.0 lb

## 2020-02-11 DIAGNOSIS — E119 Type 2 diabetes mellitus without complications: Secondary | ICD-10-CM

## 2020-02-11 DIAGNOSIS — Z23 Encounter for immunization: Secondary | ICD-10-CM | POA: Diagnosis not present

## 2020-02-11 DIAGNOSIS — Z86010 Personal history of colonic polyps: Secondary | ICD-10-CM

## 2020-02-11 LAB — POCT GLYCOSYLATED HEMOGLOBIN (HGB A1C)
Est. average glucose Bld gHb Est-mCnc: 154
Hemoglobin A1C: 7.1 % — AB (ref 4.0–5.6)

## 2020-02-11 NOTE — Progress Notes (Signed)
I,Jason Glenn,acting as a scribe for Jason Mans, MD.,have documented all relevant documentation on the behalf of Jason Mans, MD,as directed by  Jason Mans, MD while in the presence of Jason Mans, MD.   Established patient visit   Patient: Jason Glenn   DOB: 21-May-1960   59 y.o. Male  MRN: [id number] Visit Date: 02/11/2020  Today's healthcare provider: [name], MD   Chief Complaint  Patient presents with  . Diabetes  . Follow-up  . Hyperlipidemia  . Hypertension   Subjective    HPI  Patient feeling well and has no complaints. Diabetes Mellitus Type II, follow-up  Lab Results  Component Value Date   HGBA1C 7.1 (A) 02/11/2020   HGBA1C 7.5 (A) 10/09/2019   HGBA1C 7.5 (H) 06/25/2019   Last seen for diabetes 4 months ago.  Management since then includes; A1c is 7.5 today.  Patient to work on diet and exercise.  Might need to add pioglitazone on next visit. He reports good compliance with treatment. He is not having side effects. none  Home blood sugar records: fasting range: 160  Episodes of hypoglycemia? No none   Current insulin regiment: n/a Most Recent Eye Exam: 12/02/2018  -------------------------------------------------------------------- Hypertension, follow-up  BP Readings from Last 3 Encounters:  02/11/20 (!) 143/71  10/09/19 126/83  06/11/19 120/84   Wt Readings from Last 3 Encounters:  02/11/20 176 lb (79.8 kg)  10/09/19 179 lb 3.2 oz (81.3 kg)  06/11/19 173 lb (78.5 kg)     He was last seen for hypertension 4 months ago.  BP at that visit was 126/83. Management since that visit includes; Good control on losartan. He reports good compliance with treatment. He is not having side effects. none He is exercising. He is not adherent to low salt diet.   Outside blood pressures are not checking.  He does not smoke.  Use of agents associated with hypertension: none.    --------------------------------------------------------------------  Lipid/Cholesterol, follow-up  Last Lipid Panel: Lab Results  Component Value Date   CHOL 211 (H) 06/25/2019   LDLCALC 132 (H) 06/25/2019   HDL 46 06/25/2019   TRIG 183 (H) 06/25/2019    He was last seen for this 6 months ago.  Management since that visit includes; Patient on Zetia.  Intolerant of statins. He reports good compliance with treatment. He is not having side effects. none He is following a Regular diet. Current exercise: walking  Last metabolic panel Lab Results  Component Value Date   GLUCOSE 145 (H) 06/25/2019   NA 139 06/25/2019   K 4.6 06/25/2019   BUN 19 06/25/2019   CREATININE 0.84 06/25/2019   GFRNONAA 95 06/25/2019   GFRAA 109 06/25/2019   CALCIUM 9.2 06/25/2019   AST 21 06/25/2019   ALT 43 06/25/2019   The 10-year ASCVD risk score Jason George DC Jr., et al., 2013) is: 26.1%  --------------------------------------------------------------------     Medications: Outpatient Medications Prior to Visit  Medication Sig  . aspirin 81 MG tablet Take 81 mg by mouth daily.  . B Complex-C-Folic Acid (HM SUPER VITAMIN B COMPLEX/C PO) Take by mouth.  . Chromium-Cinnamon (CINNAMON PLUS CHROMIUM) [phone] MCG-MG CAPS Take by mouth.  . Coenzyme Q10 (CO Q-10) 100 MG CAPS Take by mouth.  . ezetimibe (ZETIA) 10 MG tablet TAKE 1 TABLET BY MOUTH  DAILY  . Flaxseed, Linseed, (FLAXSEED OIL) 1200 MG CAPS Take by mouth.  . Garlic 1000 MG CAPS Take by mouth.  Marland Kitchen  glucose blood (CONTOUR NEXT TEST) test strip Test twice daily and as needed  . LORazepam (ATIVAN) 0.5 MG tablet Take 1 tablet (0.5 mg total) by mouth every 8 (eight) hours as needed for anxiety.  Marland Kitchen losartan (COZAAR) 50 MG tablet TAKE 1 TABLET BY MOUTH  DAILY  . metFORMIN (GLUCOPHAGE) 1000 MG tablet TAKE 1 TABLET BY MOUTH  TWICE DAILY WITH A MEAL  . Microlet Lancets MISC Use as directed.  . Misc Natural Products (DAILY HERBS PROSTATE PO) Take by  mouth.  . Omega-3 Fatty Acids (FISH OIL) 1000 MG CPDR Take by mouth.  . Omega-3 Fatty Acids (ULTRA OMEGA 3) 952 MG CAPS Take by mouth.  . Potassium Gluconate 550 MG TABS Take by mouth.  . Red Yeast Rice 600 MG TABS Take by mouth.  . sildenafil (VIAGRA) 50 MG tablet Take 1 tablet (50 mg total) by mouth as needed for erectile dysfunction.  Marland Kitchen telmisartan (MICARDIS) 20 MG tablet Take 1 tablet (20 mg total) by mouth daily.  . vitamin E 1000 UNIT capsule Take 1,000 Units by mouth daily.  . Vitamins-Lipotropics (LIPO-FLAVONOID PLUS PO) Take by mouth.   No facility-administered medications prior to visit.    Review of Systems  Constitutional: Negative for appetite change, chills and fever.  Respiratory: Negative for chest tightness, shortness of breath and wheezing.   Cardiovascular: Negative for chest pain and palpitations.  Gastrointestinal: Negative for abdominal pain, nausea and vomiting.      Objective    BP (!) 143/71 (BP Location: Right Arm, Patient Position: Sitting, Cuff Size: Large)   Pulse 62   Temp 98.1 F (36.7 C) (Oral)   Resp 16   Ht 5\' 5"  (1.651 m)   Wt 176 lb (79.8 kg)   SpO2 99%   BMI 29.29 kg/m    Physical Exam Vitals reviewed.  Constitutional:      Appearance: Normal appearance. He is well-developed and normal weight.  HENT:     Head: Normocephalic and atraumatic.     Right Ear: Tympanic membrane and external ear normal.     Left Ear: Tympanic membrane and external ear normal.     Nose: Nose normal.     Mouth/Throat:     Pharynx: Oropharynx is clear.  Eyes:     Conjunctiva/sclera: Conjunctivae normal.     Pupils: Pupils are equal, round, and reactive to light.  Cardiovascular:     Rate and Rhythm: Normal rate and regular rhythm.     Heart sounds: Normal heart sounds.  Pulmonary:     Effort: Pulmonary effort is normal.     Breath sounds: Normal breath sounds.  Abdominal:     Palpations: Abdomen is soft.  Musculoskeletal:     Cervical back: Normal  range of motion and neck supple.  Skin:    General: Skin is warm and dry.  Neurological:     General: No focal deficit present.     Mental Status: He is alert and oriented to person, place, and time. Mental status is at baseline.  Psychiatric:        Mood and Affect: Mood normal.        Behavior: Behavior normal.        Thought Content: Thought content normal.        Judgment: Judgment normal.     BP (!) 143/71 (BP Location: Right Arm, Patient Position: Sitting, Cuff Size: Large)   Pulse 62   Temp 98.1 F (36.7 C) (Oral)   Resp 16  Ht 5\' 5"  (1.651 m)   Wt 176 lb (79.8 kg)   SpO2 99%   BMI 29.29 kg/m      Results for orders placed or performed in visit on 02/11/20  POCT glycosylated hemoglobin (Hb A1C)  Result Value Ref Range   Hemoglobin A1C 7.1 (A) 4.0 - 5.6 %   Est. average glucose Bld gHb Est-mCnc 154     Assessment & Plan     1. Type 2 diabetes mellitus without complication, without long-term current use of insulin (HCC) Continue Metformin. - POCT glycosylated hemoglobin (Hb A1C) 7.0 today.  2. Need for influenza vaccination  - Flu Vaccine QUAD 36+ mos IM Last colonoscopy November 2016 revealed tubular adenoma 3. History of colon polyps  - Ambulatory referral to Gastroenterology   Return in about 4 months (around 06/12/2020).      I, 06/14/2020, MD, have reviewed all documentation for this visit. The documentation on 02/12/20 for the exam, diagnosis, procedures, and orders are all accurate and complete.    Ilamae Geng 02/14/20, MD  [facility] [phone] (phone) [phone] (fax)  [facility]

## 2020-02-20 ENCOUNTER — Encounter: Payer: Self-pay | Admitting: *Deleted

## 2020-04-30 ENCOUNTER — Other Ambulatory Visit: Payer: Self-pay | Admitting: Family Medicine

## 2020-04-30 DIAGNOSIS — E119 Type 2 diabetes mellitus without complications: Secondary | ICD-10-CM

## 2020-06-14 ENCOUNTER — Other Ambulatory Visit: Payer: Self-pay

## 2020-06-14 ENCOUNTER — Ambulatory Visit: Payer: BC Managed Care – PPO | Admitting: Family Medicine

## 2020-06-14 VITALS — BP 147/74 | HR 62 | Temp 98.2°F | Wt 179.0 lb

## 2020-06-14 DIAGNOSIS — E7800 Pure hypercholesterolemia, unspecified: Secondary | ICD-10-CM

## 2020-06-14 DIAGNOSIS — M545 Low back pain, unspecified: Secondary | ICD-10-CM | POA: Diagnosis not present

## 2020-06-14 DIAGNOSIS — G8929 Other chronic pain: Secondary | ICD-10-CM

## 2020-06-14 DIAGNOSIS — E119 Type 2 diabetes mellitus without complications: Secondary | ICD-10-CM | POA: Diagnosis not present

## 2020-06-14 DIAGNOSIS — M25611 Stiffness of right shoulder, not elsewhere classified: Secondary | ICD-10-CM

## 2020-06-14 DIAGNOSIS — I10 Essential (primary) hypertension: Secondary | ICD-10-CM

## 2020-06-14 DIAGNOSIS — M25612 Stiffness of left shoulder, not elsewhere classified: Secondary | ICD-10-CM

## 2020-06-14 DIAGNOSIS — Z86010 Personal history of colonic polyps: Secondary | ICD-10-CM

## 2020-06-14 DIAGNOSIS — Z1211 Encounter for screening for malignant neoplasm of colon: Secondary | ICD-10-CM

## 2020-06-14 NOTE — Progress Notes (Signed)
Established patient visit   Patient: Jason Glenn   DOB: December 01, 1960   60 y.o. Male  MRN: [id number] Visit Date: 06/14/2020  Today's healthcare provider: Wilhemena Durie, MD   No chief complaint on file.  Subjective    HPI  Patient comes in today for follow-up.  He feels well other than occasional bilateral shoulder and hip soreness.  These are not predictable.  It is with movement in both of these areas.  It is not made worse by walking.  No chest pain or dyspnea. He has declined to get any Covid vaccines and we did discussed this.  I recommend the vaccine Diabetes Mellitus Type II, Follow-up  Lab Results  Component Value Date   HGBA1C 7.1 (A) 02/11/2020   HGBA1C 7.5 (A) 10/09/2019   HGBA1C 7.5 (H) 06/25/2019   Wt Readings from Last 3 Encounters:  06/14/20 179 lb (81.2 kg)  02/11/20 176 lb (79.8 kg)  10/09/19 179 lb 3.2 oz (81.3 kg)   Last seen for diabetes 5 months ago.  Management since then includes none. He reports good compliance with treatment. He is not having side effects.  Symptoms: No fatigue No foot ulcerations  No appetite changes No nausea  No paresthesia of the feet  No polydipsia  No polyuria No visual disturbances   No vomiting     Home blood sugar records: averaging about 160  Episodes of hypoglycemia? No    Current insulin regiment: none  Most Recent Eye Exam: within the last couple of months Current exercise: none   Pertinent Labs: Lab Results  Component Value Date   CHOL 211 (H) 06/25/2019   HDL 46 06/25/2019   LDLCALC 132 (H) 06/25/2019   TRIG 183 (H) 06/25/2019   CHOLHDL 4.6 06/25/2019   Lab Results  Component Value Date   NA 139 06/25/2019   K 4.6 06/25/2019   CREATININE 0.84 06/25/2019   GFRNONAA 95 06/25/2019   GFRAA 109 06/25/2019   GLUCOSE 145 (H) 06/25/2019     --------------------------------------------------------------------------------------------------- Patient is due for labs.       Medications: Outpatient Medications Prior to Visit  Medication Sig  . aspirin 81 MG tablet Take 81 mg by mouth daily.  . B Complex-C-Folic Acid (HM SUPER VITAMIN B COMPLEX/C PO) Take by mouth.  . Chromium-Cinnamon [phone] MCG-MG CAPS Take by mouth.  . Coenzyme Q10 (CO Q-10) 100 MG CAPS Take by mouth.  . ezetimibe (ZETIA) 10 MG tablet TAKE 1 TABLET BY MOUTH  DAILY  . Flaxseed, Linseed, (FLAXSEED OIL) 1200 MG CAPS Take by mouth.  . Garlic 3094 MG CAPS Take by mouth.  Marland Kitchen glucose blood (CONTOUR NEXT TEST) test strip Test twice daily and as needed  . losartan (COZAAR) 50 MG tablet TAKE 1 TABLET BY MOUTH  DAILY  . metFORMIN (GLUCOPHAGE) 1000 MG tablet TAKE 1 TABLET BY MOUTH  TWICE DAILY WITH A MEAL  . Microlet Lancets MISC Use as directed.  . Misc Natural Products (DAILY HERBS PROSTATE PO) Take by mouth.  . Omega-3 Fatty Acids (FISH OIL) 1000 MG CPDR Take by mouth.  . Omega-3 Fatty Acids (ULTRA OMEGA 3) 952 MG CAPS Take by mouth.  . Potassium Gluconate 550 MG TABS Take by mouth.  . Red Yeast Rice 600 MG TABS Take by mouth.  . sildenafil (VIAGRA) 50 MG tablet Take 1 tablet (50 mg total) by mouth as needed for erectile dysfunction.  Marland Kitchen telmisartan (MICARDIS) 20 MG tablet Take 1 tablet (20 mg  total) by mouth daily.  . vitamin E 1000 UNIT capsule Take 1,000 Units by mouth daily.  . Vitamins-Lipotropics (LIPO-FLAVONOID PLUS PO) Take by mouth.   No facility-administered medications prior to visit.    Review of Systems  Cardiovascular: Negative for chest pain, palpitations and leg swelling.  Endocrine: Negative for polydipsia and polyuria.  Musculoskeletal: Positive for arthralgias (shoulder pain).  Neurological: Negative for dizziness and headaches.        Objective    BP (!) 147/74 (BP Location: Right Arm, Patient Position: Sitting, Cuff Size: Normal)   Pulse 62   Temp 98.2 F (36.8 C)   Wt 179 lb (81.2 kg)   SpO2 100%   BMI 29.79 kg/m      Physical Exam Vitals reviewed.   Constitutional:      Appearance: Normal appearance. He is well-developed and normal weight.  HENT:     Head: Normocephalic and atraumatic.     Right Ear: Tympanic membrane and external ear normal.     Left Ear: Tympanic membrane and external ear normal.     Nose: Nose normal.     Mouth/Throat:     Pharynx: Oropharynx is clear.  Eyes:     Conjunctiva/sclera: Conjunctivae normal.     Pupils: Pupils are equal, round, and reactive to light.  Cardiovascular:     Rate and Rhythm: Normal rate and regular rhythm.     Heart sounds: Normal heart sounds.  Pulmonary:     Effort: Pulmonary effort is normal.     Breath sounds: Normal breath sounds.  Abdominal:     General: Bowel sounds are normal.     Palpations: Abdomen is soft.  Musculoskeletal:     Cervical back: Normal range of motion and neck supple.  Skin:    General: Skin is warm and dry.  Neurological:     General: No focal deficit present.     Mental Status: He is alert and oriented to person, place, and time.  Psychiatric:        Mood and Affect: Mood normal.        Behavior: Behavior normal.        Thought Content: Thought content normal.        Judgment: Judgment normal.       No results found for any visits on 06/14/20.  Assessment & Plan      1. Type 2 diabetes mellitus without complication, without long-term current use of insulin (HCC) Goal A1c less than 7 - Hemoglobin A1c  2. Pure hypercholesterolemia Goal LDL less than 70 - Comprehensive metabolic panel - Hemoglobin A1c - Lipid Panel With LDL/HDL Ratio  3. Primary hypertension On telmisartan - CBC with Differential/Platelet - Comprehensive metabolic panel - TSH  4. Chronic bilateral low back pain without sciatica Check sed rate to consider polymyalgia rheumatica - Sed Rate (ESR)  5. Shoulder joint stiffness, bilateral  - Sed Rate (ESR)  6. History of colon polyps For back to GI. - Ambulatory referral to Gastroenterology  7. Screening for  colon cancer  - Ambulatory referral to Gastroenterology   No follow-ups on file.      I, Wilhemena Durie, MD, have reviewed all documentation for this visit. The documentation on 06/14/20 for the exam, diagnosis, procedures, and orders are all accurate and complete.    Jany Buckwalter Cranford Mon, MD  [facility] [phone] (phone) [phone] (fax)  [place]

## 2020-06-23 ENCOUNTER — Telehealth (INDEPENDENT_AMBULATORY_CARE_PROVIDER_SITE_OTHER): Payer: Self-pay | Admitting: Gastroenterology

## 2020-06-23 ENCOUNTER — Other Ambulatory Visit: Payer: Self-pay

## 2020-06-23 DIAGNOSIS — Z86010 Personal history of colonic polyps: Secondary | ICD-10-CM

## 2020-06-23 MED ORDER — NA SULFATE-K SULFATE-MG SULF 17.5-3.13-1.6 GM/177ML PO SOLN: 1.0000 | Freq: Once | ORAL | 0 refills | Status: AC

## 2020-06-23 NOTE — Progress Notes (Signed)
Gastroenterology Pre-Procedure Review  Request Date: Friday 07/23/20 Requesting Physician: Dr. Maximino Greenland  PATIENT REVIEW QUESTIONS: The patient's wife responded to the following health history questions as indicated:    1. Are you having any GI issues? no 2. Do you have a personal history of Polyps?yes 3. Do you have a family history of Colon Cancer or Polyps? no 4. Diabetes Mellitus? yes 5. Joint replacements in the past 12 months?no 6. Major health problems in the past 3 months?no 7. Any artificial heart valves, MVP, or defibrillator?no    MEDICATIONS & ALLERGIES:    Patient reports the following regarding taking any anticoagulation/antiplatelet therapy:   Plavix, Coumadin, Eliquis, Xarelto, Lovenox, Pradaxa, Brilinta, or Effient? No Aspirin? No  Patient confirms/reports the following medications:  Current Outpatient Medications  Medication Sig Dispense Refill  . aspirin 81 MG tablet Take 81 mg by mouth daily.    . B Complex-C-Folic Acid (HM SUPER VITAMIN B COMPLEX/C PO) Take by mouth.    . Chromium-Cinnamon [phone] MCG-MG CAPS Take by mouth.    . Coenzyme Q10 (CO Q-10) 100 MG CAPS Take by mouth.    . ezetimibe (ZETIA) 10 MG tablet TAKE 1 TABLET BY MOUTH  DAILY 90 tablet 3  . Garlic 1000 MG CAPS Take by mouth.    Marland Kitchen glucose blood (CONTOUR NEXT TEST) test strip Test twice daily and as needed 100 each 12  . losartan (COZAAR) 50 MG tablet TAKE 1 TABLET BY MOUTH  DAILY 40 tablet 8  . metFORMIN (GLUCOPHAGE) 1000 MG tablet TAKE 1 TABLET BY MOUTH  TWICE DAILY WITH A MEAL 180 tablet 0  . Microlet Lancets MISC Use as directed. 100 each 12  . Misc Natural Products (DAILY HERBS PROSTATE PO) Take by mouth.    . Omega-3 Fatty Acids (ULTRA OMEGA 3) 952 MG CAPS Take by mouth.    . Red Yeast Rice 600 MG TABS Take by mouth.    . vitamin E 1000 UNIT capsule Take 1,000 Units by mouth daily.    . Flaxseed, Linseed, (FLAXSEED OIL) 1200 MG CAPS Take by mouth. (Patient not taking: Reported on  06/23/2020)    . Omega-3 Fatty Acids (FISH OIL) 1000 MG CPDR Take by mouth. (Patient not taking: Reported on 06/23/2020)    . Potassium Gluconate 550 MG TABS Take by mouth. (Patient not taking: Reported on 06/23/2020)    . sildenafil (VIAGRA) 50 MG tablet Take 1 tablet (50 mg total) by mouth as needed for erectile dysfunction. (Patient not taking: Reported on 06/23/2020) 6 tablet 5  . telmisartan (MICARDIS) 20 MG tablet Take 1 tablet (20 mg total) by mouth daily. (Patient not taking: Reported on 06/23/2020) 90 tablet 3  . Vitamins-Lipotropics (LIPO-FLAVONOID PLUS PO) Take by mouth. (Patient not taking: Reported on 06/23/2020)     No current facility-administered medications for this visit.    Patient confirms/reports the following allergies:  Allergies  Allergen Reactions  . Pravastatin Sodium     Other reaction(s): Muscle Cramps    No orders of the defined types were placed in this encounter.   AUTHORIZATION INFORMATION Primary Insurance: 1D#: Group #:  Secondary Insurance: 1D#: Group #:  SCHEDULE INFORMATION: Date: 07/23/20 Time: Location:[facility]

## 2020-06-24 ENCOUNTER — Other Ambulatory Visit: Payer: Self-pay

## 2020-06-24 ENCOUNTER — Telehealth: Payer: Self-pay

## 2020-06-24 DIAGNOSIS — Z86010 Personal history of colonic polyps: Secondary | ICD-10-CM

## 2020-06-24 MED ORDER — PEG 3350-KCL-NA BICARB-NACL 420 G PO SOLR: 4000.0000 mL | Freq: Once | ORAL | 0 refills | Status: AC

## 2020-06-24 NOTE — Telephone Encounter (Signed)
Left voice message notifying patient and wife that rx has been changed to [facility].  Instructions were provided on voice message as well.  Thanks,  [place], [place]

## 2020-06-28 DIAGNOSIS — E7800 Pure hypercholesterolemia, unspecified: Secondary | ICD-10-CM | POA: Diagnosis not present

## 2020-06-28 DIAGNOSIS — E119 Type 2 diabetes mellitus without complications: Secondary | ICD-10-CM | POA: Diagnosis not present

## 2020-06-28 DIAGNOSIS — I10 Essential (primary) hypertension: Secondary | ICD-10-CM | POA: Diagnosis not present

## 2020-06-29 LAB — HEMOGLOBIN A1C
Est. average glucose Bld gHb Est-mCnc: 169 mg/dL
Hgb A1c MFr Bld: 7.5 % — ABNORMAL HIGH (ref 4.8–5.6)

## 2020-06-29 LAB — COMPREHENSIVE METABOLIC PANEL
ALT: 33 IU/L (ref 0–44)
AST: 20 IU/L (ref 0–40)
Albumin/Globulin Ratio: 1.8 (ref 1.2–2.2)
Albumin: 4.7 g/dL (ref 3.8–4.8)
Alkaline Phosphatase: 72 IU/L (ref 44–121)
BUN/Creatinine Ratio: 17 (ref 10–24)
BUN: 17 mg/dL (ref 8–27)
Bilirubin Total: 0.5 mg/dL (ref 0.0–1.2)
CO2: 25 mmol/L (ref 20–29)
Calcium: 9.7 mg/dL (ref 8.6–10.2)
Chloride: 101 mmol/L (ref 96–106)
Creatinine, Ser: 1.03 mg/dL (ref 0.76–1.27)
GFR calc Af Amer: 90 mL/min/{1.73_m2} (ref >59)
GFR calc non Af Amer: 77 mL/min/{1.73_m2} (ref >59)
Globulin, Total: 2.6 g/dL (ref 1.5–4.5)
Glucose: 158 mg/dL — ABNORMAL HIGH (ref 65–99)
Potassium: 5.3 mmol/L — ABNORMAL HIGH (ref 3.5–5.2)
Sodium: 141 mmol/L (ref 134–144)
Total Protein: 7.3 g/dL (ref 6.0–8.5)

## 2020-06-29 LAB — LIPID PANEL WITH LDL/HDL RATIO
Cholesterol, Total: 216 mg/dL — ABNORMAL HIGH (ref 100–199)
HDL: 47 mg/dL (ref >39)
LDL Chol Calc (NIH): 137 mg/dL — ABNORMAL HIGH (ref 0–99)
LDL/HDL Ratio: 2.9 ratio (ref 0.0–3.6)
Triglycerides: 176 mg/dL — ABNORMAL HIGH (ref 0–149)
VLDL Cholesterol Cal: 32 mg/dL (ref 5–40)

## 2020-06-29 LAB — CBC WITH DIFFERENTIAL/PLATELET
Basophils Absolute: 0.1 10*3/uL (ref 0.0–0.2)
Basos: 1 %
EOS (ABSOLUTE): 0.6 10*3/uL — ABNORMAL HIGH (ref 0.0–0.4)
Eos: 9 %
Hematocrit: 42.4 % (ref 37.5–51.0)
Hemoglobin: 14.1 g/dL (ref 13.0–17.7)
Immature Grans (Abs): 0 10*3/uL (ref 0.0–0.1)
Immature Granulocytes: 0 %
Lymphocytes Absolute: 2.2 10*3/uL (ref 0.7–3.1)
Lymphs: 33 %
MCH: 30 pg (ref 26.6–33.0)
MCHC: 33.3 g/dL (ref 31.5–35.7)
MCV: 90 fL (ref 79–97)
Monocytes Absolute: 0.5 10*3/uL (ref 0.1–0.9)
Monocytes: 7 %
Neutrophils Absolute: 3.4 10*3/uL (ref 1.4–7.0)
Neutrophils: 50 %
Platelets: 282 10*3/uL (ref 150–450)
RBC: 4.7 x10E6/uL (ref 4.14–5.80)
RDW: 12.7 % (ref 11.6–15.4)
WBC: 6.8 10*3/uL (ref 3.4–10.8)

## 2020-06-29 LAB — SEDIMENTATION RATE: Sed Rate: 3 mm/hr (ref 0–30)

## 2020-06-29 LAB — TSH: TSH: 3.51 u[IU]/mL (ref 0.450–4.500)

## 2020-07-01 ENCOUNTER — Telehealth: Payer: Self-pay | Admitting: Family Medicine

## 2020-07-01 ENCOUNTER — Other Ambulatory Visit: Payer: Self-pay

## 2020-07-01 DIAGNOSIS — E7849 Other hyperlipidemia: Secondary | ICD-10-CM

## 2020-07-01 MED ORDER — EZETIMIBE 10 MG PO TABS: 10.0000 mg | ORAL_TABLET | Freq: Every day | ORAL | 3 refills | Status: DC

## 2020-07-01 NOTE — Telephone Encounter (Signed)
Pt returned call for results; result note read to patient. Agreeable to Zetia. Would like sent to [facility].

## 2020-07-07 ENCOUNTER — Other Ambulatory Visit: Payer: Self-pay | Admitting: Family Medicine

## 2020-07-07 DIAGNOSIS — E119 Type 2 diabetes mellitus without complications: Secondary | ICD-10-CM

## 2020-07-19 ENCOUNTER — Telehealth: Payer: Self-pay

## 2020-07-19 DIAGNOSIS — E119 Type 2 diabetes mellitus without complications: Secondary | ICD-10-CM

## 2020-07-19 MED ORDER — LOSARTAN POTASSIUM 50 MG PO TABS: 50.0000 mg | ORAL_TABLET | Freq: Every day | ORAL | 3 refills | Status: DC

## 2020-07-19 NOTE — Telephone Encounter (Signed)
[facility] faxed refill request for the following medications: ° °losartan (COZAAR) 50 MG tablet  ° ° °Please advise. °

## 2020-07-19 NOTE — Telephone Encounter (Signed)
Medication sent into the pharmacy. 

## 2020-07-19 NOTE — Addendum Note (Signed)
Addended by: Anson [place] on: 07/19/2020 10:47 AM   Modules accepted: Orders

## 2020-07-21 ENCOUNTER — Other Ambulatory Visit
Admission: RE | Admit: 2020-07-21 | Discharge: 2020-07-21 | Disposition: A | Discharge status: Home / Self Care | Payer: BC Managed Care – PPO | Source: Ambulatory Visit | Attending: Gastroenterology | Admitting: Gastroenterology

## 2020-07-21 ENCOUNTER — Other Ambulatory Visit: Payer: Self-pay

## 2020-07-21 DIAGNOSIS — Z20822 Contact with and (suspected) exposure to covid-19: Secondary | ICD-10-CM | POA: Insufficient documentation

## 2020-07-21 DIAGNOSIS — Z1211 Encounter for screening for malignant neoplasm of colon: Secondary | ICD-10-CM | POA: Diagnosis not present

## 2020-07-21 DIAGNOSIS — Z79899 Other long term (current) drug therapy: Secondary | ICD-10-CM | POA: Diagnosis not present

## 2020-07-21 DIAGNOSIS — Z01812 Encounter for preprocedural laboratory examination: Secondary | ICD-10-CM | POA: Insufficient documentation

## 2020-07-21 DIAGNOSIS — Z7984 Long term (current) use of oral hypoglycemic drugs: Secondary | ICD-10-CM | POA: Diagnosis not present

## 2020-07-21 DIAGNOSIS — Z808 Family history of malignant neoplasm of other organs or systems: Secondary | ICD-10-CM | POA: Diagnosis not present

## 2020-07-21 DIAGNOSIS — Z7982 Long term (current) use of aspirin: Secondary | ICD-10-CM | POA: Diagnosis not present

## 2020-07-21 LAB — SARS CORONAVIRUS 2 (TAT 6-24 HRS): SARS Coronavirus 2: NEGATIVE

## 2020-07-22 ENCOUNTER — Encounter: Payer: Self-pay | Admitting: Gastroenterology

## 2020-07-23 ENCOUNTER — Encounter: Payer: Self-pay | Admitting: Gastroenterology

## 2020-07-23 ENCOUNTER — Ambulatory Visit: Payer: BC Managed Care – PPO | Admitting: Certified Registered Nurse Anesthetist

## 2020-07-23 ENCOUNTER — Ambulatory Visit
Admission: RE | Admit: 2020-07-23 | Discharge: 2020-07-23 | Disposition: A | Discharge status: Home / Self Care | Payer: BC Managed Care – PPO | Attending: Gastroenterology | Admitting: Gastroenterology

## 2020-07-23 ENCOUNTER — Encounter
Admission: RE | Disposition: A | Discharge status: Home / Self Care | Payer: Self-pay | Source: Home / Self Care | Attending: Gastroenterology

## 2020-07-23 DIAGNOSIS — Z7984 Long term (current) use of oral hypoglycemic drugs: Secondary | ICD-10-CM | POA: Insufficient documentation

## 2020-07-23 DIAGNOSIS — Z86010 Personal history of colonic polyps: Secondary | ICD-10-CM | POA: Diagnosis not present

## 2020-07-23 DIAGNOSIS — Z808 Family history of malignant neoplasm of other organs or systems: Secondary | ICD-10-CM | POA: Diagnosis not present

## 2020-07-23 DIAGNOSIS — E119 Type 2 diabetes mellitus without complications: Secondary | ICD-10-CM | POA: Diagnosis not present

## 2020-07-23 DIAGNOSIS — Z7982 Long term (current) use of aspirin: Secondary | ICD-10-CM | POA: Insufficient documentation

## 2020-07-23 DIAGNOSIS — Z79899 Other long term (current) drug therapy: Secondary | ICD-10-CM | POA: Insufficient documentation

## 2020-07-23 DIAGNOSIS — I10 Essential (primary) hypertension: Secondary | ICD-10-CM | POA: Diagnosis not present

## 2020-07-23 DIAGNOSIS — Z20822 Contact with and (suspected) exposure to covid-19: Secondary | ICD-10-CM | POA: Diagnosis not present

## 2020-07-23 DIAGNOSIS — Z1211 Encounter for screening for malignant neoplasm of colon: Secondary | ICD-10-CM | POA: Insufficient documentation

## 2020-07-23 HISTORY — PX: COLONOSCOPY WITH PROPOFOL: SHX5780

## 2020-07-23 LAB — GLUCOSE, CAPILLARY: Glucose-Capillary: 146 mg/dL — ABNORMAL HIGH (ref 70–99)

## 2020-07-23 SURGERY — COLONOSCOPY WITH PROPOFOL
Anesthesia: General

## 2020-07-23 MED ORDER — LIDOCAINE HCL (CARDIAC) PF 100 MG/5ML IV SOSY
PREFILLED_SYRINGE | INTRAVENOUS | Status: DC | PRN
  Administered 2020-07-23: 50 mg via INTRAVENOUS

## 2020-07-23 MED ORDER — SODIUM CHLORIDE 0.9 % IV SOLN: INTRAVENOUS | Status: DC

## 2020-07-23 MED ORDER — PROPOFOL 10 MG/ML IV BOLUS
INTRAVENOUS | Status: DC | PRN
  Administered 2020-07-23: 30 mg via INTRAVENOUS
  Administered 2020-07-23: 20 mg via INTRAVENOUS
  Administered 2020-07-23: 70 mg via INTRAVENOUS

## 2020-07-23 MED ORDER — PROPOFOL 500 MG/50ML IV EMUL
INTRAVENOUS | Status: DC | PRN
  Administered 2020-07-23: 160 ug/kg/min via INTRAVENOUS

## 2020-07-23 NOTE — Anesthesia Preprocedure Evaluation (Signed)
Anesthesia Evaluation  Patient identified by MRN, date of birth, ID band Patient awake    Reviewed: Allergy & Precautions, NPO status , Patient's Chart, lab work & pertinent test results  History of Anesthesia Complications Negative for: history of anesthetic complications  Airway Mallampati: II       Dental   Pulmonary neg sleep apnea, neg COPD, Not current smoker,           Cardiovascular hypertension, Pt. on medications (-) Past MI and (-) CHF (-) dysrhythmias (-) Valvular Problems/Murmurs     Neuro/Psych neg Seizures Anxiety    GI/Hepatic Neg liver ROS, neg GERD  ,  Endo/Other  diabetes, Type 2, Oral Hypoglycemic Agents  Renal/GU negative Renal ROS     Musculoskeletal   Abdominal   Peds  Hematology   Anesthesia Other Findings   Reproductive/Obstetrics                             Anesthesia Physical Anesthesia Plan  ASA: III  Anesthesia Plan: General   Post-op Pain Management:    Induction: Intravenous  PONV Risk Score and Plan: Propofol infusion and TIVA  Airway Management Planned: Nasal Cannula  Additional Equipment:   Intra-op Plan:   Post-operative Plan:   Informed Consent: I have reviewed the patients History and Physical, chart, labs and discussed the procedure including the risks, benefits and alternatives for the proposed anesthesia with the patient or authorized representative who has indicated his/her understanding and acceptance.       Plan Discussed with:   Anesthesia Plan Comments:         Anesthesia Quick Evaluation

## 2020-07-23 NOTE — H&P (Signed)
Melodie Bouillon, MD [address], [place], [place], [address] [address], [address], [place], [place], [address] Phone: [phone]  Fax: [phone]  Primary Care Physician:  Maple Hudson., MD   Pre-Procedure History & Physical: HPI:  Jason Glenn is a 60 y.o. male is here for a colonoscopy.   Past Medical History:  Diagnosis Date  . Anxiety   . Diabetes mellitus without complication (HCC)   . Hyperlipemia   . Hyperlipidemia   . Hypertension     Past Surgical History:  Procedure Laterality Date  . COLONOSCOPY    . POLYPECTOMY     tubular adenoma from colon    Prior to Admission medications   Medication Sig Start Date End Date Taking? Authorizing Provider  aspirin 81 MG tablet Take 81 mg by mouth daily.   Yes [provider]  B Complex-C-Folic Acid (HM SUPER VITAMIN B COMPLEX/C PO) Take by mouth.   Yes [provider]  Chromium-Cinnamon [phone] MCG-MG CAPS Take by mouth.   Yes [provider]  Coenzyme Q10 (CO Q-10) 100 MG CAPS Take by mouth.   Yes [provider]  ezetimibe (ZETIA) 10 MG tablet Take 1 tablet (10 mg total) by mouth daily. 07/01/20  Yes Maple Hudson., MD  Garlic 1000 MG CAPS Take by mouth.   Yes [provider]  losartan (COZAAR) 50 MG tablet Take 1 tablet (50 mg total) by mouth daily. 07/19/20  Yes Maple Hudson., MD  metFORMIN (GLUCOPHAGE) 1000 MG tablet TAKE 1 TABLET BY MOUTH  TWICE DAILY WITH A MEAL 07/07/20  Yes Maple Hudson., MD  Microlet Lancets MISC Use as directed. 11/21/19  Yes Maple Hudson., MD  Misc Natural Products (DAILY HERBS PROSTATE PO) Take by mouth.   Yes [provider]  Multiple Vitamins-Minerals (ZINC PO) Take by mouth.   Yes [provider]  Omega-3 Fatty Acids (ULTRA OMEGA 3) 952 MG CAPS Take by mouth.   Yes [provider]  Red Yeast Rice 600 MG TABS Take by mouth.   Yes [provider]  vitamin  E 1000 UNIT capsule Take 1,000 Units by mouth daily.   Yes [provider]  Flaxseed, Linseed, (FLAXSEED OIL) 1200 MG CAPS Take by mouth. Patient not taking: No sig reported    [provider]  glucose blood (CONTOUR NEXT TEST) test strip Test twice daily and as needed 10/15/19   Maple Hudson., MD  Omega-3 Fatty Acids (FISH OIL) 1000 MG CPDR Take by mouth. Patient not taking: No sig reported    [provider]  Potassium Gluconate 550 MG TABS Take by mouth. Patient not taking: No sig reported    [provider]  sildenafil (VIAGRA) 50 MG tablet Take 1 tablet (50 mg total) by mouth as needed for erectile dysfunction. Patient not taking: No sig reported 10/09/19   Maple Hudson., MD  telmisartan (MICARDIS) 20 MG tablet Take 1 tablet (20 mg total) by mouth daily. Patient not taking: No sig reported 04/22/18   Maple Hudson., MD  Vitamins-Lipotropics (LIPO-FLAVONOID PLUS PO) Take by mouth. Patient not taking: No sig reported    [provider]    Allergies as of 06/23/2020 - Review Complete 06/14/2020  Allergen Reaction Noted  . Pravastatin sodium  10/08/2014    Family History  Problem Relation Age of Onset  . Cancer Mother        type  of salivary vs carotid gland cancer threated wuth chemotherapy  . CAD Father   . Leukemia Father     Social History   Socioeconomic History  . Marital status: Married    Spouse name: Not on file  . Number of children: Not on file  . Years of education: Not on file  . Highest education level: Not on file  Occupational History  . Not on file  Tobacco Use  . Smoking status: Never Smoker  . Smokeless tobacco: Never Used  Vaping Use  . Vaping Use: Never used  Substance and Sexual Activity  . Alcohol use: No  . Drug use: No  . Sexual activity: Yes  Other Topics Concern  . Not on file  Social History Narrative  . Not on file   Social Determinants of Health   Financial Resource  Strain: Not on file  Food Insecurity: Not on file  Transportation Needs: Not on file  Physical Activity: Not on file  Stress: Not on file  Social Connections: Not on file  Intimate Partner Violence: Not on file    Review of Systems: See HPI, otherwise negative ROS  Physical Exam: BP (!) 191/94   Pulse (!) 59   Temp (!) 97 F (36.1 C) (Temporal)   Ht 5\' 5"  (1.651 m)   Wt 81.2 kg   SpO2 100%   BMI 29.79 kg/m  General:   Alert,  pleasant and cooperative in NAD Head:  Normocephalic and atraumatic. Neck:  Supple; no masses or thyromegaly. Lungs:  Clear throughout to auscultation, normal respiratory effort.    Heart:  +S1, +S2, Regular rate and rhythm, No edema. Abdomen:  Soft, nontender and nondistended. Normal bowel sounds, without guarding, and without rebound.   Neurologic:  Alert and  oriented x4;  grossly normal neurologically.  Impression/Plan: Jason Glenn is here for a colonoscopy to be performed for history of polyps, 2016 Tubular adenoma pathology report under media. Procedure report not available  Risks, benefits, limitations, and alternatives regarding  colonoscopy have been reviewed with the patient.  Questions have been answered.  All parties agreeable.   2017, MD  07/23/2020, 9:56 AM

## 2020-07-23 NOTE — Op Note (Signed)
[facility] Patient Name: Jason Glenn Procedure Date: 07/23/2020 10:02 AM MRN: [id number] Account #: [id number] Date of Birth: 28-Jan-1961 Admit Type: Outpatient Age: 60 Room: [facility] ENDO ROOM 1 Gender: Male Note Status: Finalized Procedure:             Colonoscopy Indications:           Screening for colorectal malignant neoplasm Providers:             Makaia Rappa B. Maximino Greenland MD, MD Referring MD:          Ferdinand Lango. Sullivan Lone, MD (Referring MD) Medicines:             Monitored Anesthesia Care Complications:         No immediate complications. Procedure:             Pre-Anesthesia Assessment:                        - Prior to the procedure, a History and Physical was                         performed, and patient medications, allergies and                         sensitivities were reviewed. The patient's tolerance                         of previous anesthesia was reviewed.                        - The risks and benefits of the procedure and the                         sedation options and risks were discussed with the                         patient. All questions were answered and informed                         consent was obtained.                        - Patient identification and proposed procedure were                         verified prior to the procedure by the physician, the                         nurse, the anesthetist and the technician. The                         procedure was verified in the pre-procedure area in                         the procedure room in the endoscopy suite.                        - ASA Grade Assessment: II - A patient with mild  systemic disease.                        - After reviewing the risks and benefits, the patient                         was deemed in satisfactory condition to undergo the                         procedure.                        After obtaining informed consent, the  colonoscope was                         passed under direct vision. Throughout the procedure,                         the patient's blood pressure, pulse, and oxygen                         saturations were monitored continuously. The                         Colonoscope was introduced through the anus and                         advanced to the the cecum, identified by appendiceal                         orifice and ileocecal valve. The colonoscopy was                         performed with ease. The patient tolerated the                         procedure well. The quality of the bowel preparation                         was good. Findings:      The perianal and digital rectal examinations were normal.      The rectum, sigmoid colon, descending colon, transverse colon, ascending       colon and cecum appeared normal.      The retroflexed view of the distal rectum and anal verge was normal and       showed no anal or rectal abnormalities. Impression:            - The rectum, sigmoid colon, descending colon,                         transverse colon, ascending colon and cecum are normal.                        - The distal rectum and anal verge are normal on                         retroflexion view.                        - No  specimens collected. Recommendation:        - Discharge patient to home.                        - Resume previous diet.                        - Continue present medications.                        - Repeat colonoscopy in 10 years for surveillance.                        - Return to primary care physician as previously                         scheduled.                        - The findings and recommendations were discussed with                         the patient.                        - The findings and recommendations were discussed with                         the patient's family. Procedure Code(s):     --- Professional ---                        [phone],  Colonoscopy, flexible; diagnostic, including                         collection of specimen(s) by brushing or washing, when                         performed (separate procedure) Diagnosis Code(s):     --- Professional ---                        Z12.11, Encounter for screening for malignant neoplasm                         of colon CPT copyright 2019 [employer] Medical Association. All rights reserved. The codes documented in this report are preliminary and upon coder review may  be revised to meet current compliance requirements.  Melodie Bouillon, MD Michel Bickers B. Maximino Greenland MD, MD 07/23/2020 10:50:53 AM This report has been signed electronically. Number of Addenda: 0 Note Initiated On: 07/23/2020 10:02 AM Scope Withdrawal Time: 0 hours 23 minutes 28 seconds  Total Procedure Duration: 0 hours 31 minutes 12 seconds       [facility]

## 2020-07-23 NOTE — Transfer of Care (Signed)
Immediate Anesthesia Transfer of Care Note  Patient: Jason Glenn  Procedure(s) Performed: COLONOSCOPY WITH PROPOFOL (N/A )  Patient Location: Endoscopy Unit  Anesthesia Type:General  Level of Consciousness: drowsy  Airway & Oxygen Therapy: Patient Spontanous Breathing  Post-op Assessment: Report given to RN and Post -op Vital signs reviewed and stable  Post vital signs: Reviewed and stable  Last Vitals:  Vitals Value Taken Time  BP 109/57 07/23/20 1047  Temp    Pulse 69 07/23/20 1047  Resp 11 07/23/20 1047  SpO2 96 % 07/23/20 1047  Vitals shown include unvalidated device data.  Last Pain:  Vitals:   07/23/20 0948  TempSrc: Temporal  PainSc: 0-No pain         Complications: No complications documented.

## 2020-07-23 NOTE — Anesthesia Postprocedure Evaluation (Signed)
Anesthesia Post Note  Patient: Jason Glenn  Procedure(s) Performed: COLONOSCOPY WITH PROPOFOL (N/A )  Patient location during evaluation: Endoscopy Anesthesia Type: General Level of consciousness: awake and alert Pain management: pain level controlled Vital Signs Assessment: post-procedure vital signs reviewed and stable Respiratory status: spontaneous breathing and respiratory function stable Cardiovascular status: stable Anesthetic complications: no   No complications documented.   Last Vitals:  Vitals:   07/23/20 1040 07/23/20 1050  BP: (!) 109/57 116/69  Pulse: 69 70  Resp:  12  Temp: (!) 36.1 C   SpO2: 96% 95%    Last Pain:  Vitals:   07/23/20 1040  TempSrc: Temporal  PainSc:                  Micharl Helmes K

## 2020-09-13 ENCOUNTER — Ambulatory Visit: Payer: BC Managed Care – PPO | Admitting: Family Medicine

## 2020-09-13 ENCOUNTER — Other Ambulatory Visit: Payer: Self-pay

## 2020-09-13 ENCOUNTER — Encounter: Payer: Self-pay | Admitting: Family Medicine

## 2020-09-13 VITALS — BP 148/89 | HR 60 | Temp 98.0°F | Resp 16 | Ht 65.0 in | Wt 178.0 lb

## 2020-09-13 DIAGNOSIS — E119 Type 2 diabetes mellitus without complications: Secondary | ICD-10-CM

## 2020-09-13 DIAGNOSIS — E7849 Other hyperlipidemia: Secondary | ICD-10-CM

## 2020-09-13 DIAGNOSIS — I10 Essential (primary) hypertension: Secondary | ICD-10-CM | POA: Diagnosis not present

## 2020-09-13 MED ORDER — LOSARTAN POTASSIUM 100 MG PO TABS: 100.0000 mg | ORAL_TABLET | Freq: Every day | ORAL | 0 refills | Status: DC

## 2020-09-13 MED ORDER — AMLODIPINE BESYLATE 5 MG PO TABS: 5.0000 mg | ORAL_TABLET | Freq: Every day | ORAL | 0 refills | Status: DC

## 2020-09-13 NOTE — Progress Notes (Signed)
I,[name],acting as a scribe for [name], MD.,have documented all relevant documentation on the behalf of [name], MD,as directed by  [name], MD while in the presence of [name], MD.   Established patient visit   Patient: Jason Glenn [facility]   DOB: December 17, 1960   60 y.o. Male  MRN: [id number] Visit Date: 09/13/2020  Today's healthcare provider: [name], MD   Chief Complaint  Patient presents with  . Follow-up  . Diabetes  . Hypertension   Subjective    HPI  Patient feels well and has no issues other than occasional headache.  This appears to be occipital and in the top of the head.  He thought it was sinuses.  It is mild and not bothering him presently.  No pain in the temples and no jaw claudication Diabetes Mellitus Type II, follow-up  Lab Results  Component Value Date   HGBA1C 7.5 (H) 06/28/2020   HGBA1C 7.1 (A) 02/11/2020   HGBA1C 7.5 (A) 10/09/2019   Last seen for diabetes 3 months ago.  Management since then includes continuing the same treatment. He reports good compliance with treatment. He is not having side effects. none  Home blood sugar records: fasting range: 135-140  Episodes of hypoglycemia? No none   Current insulin regiment: n/a Most Recent Eye Exam: due  --------------------------------------------------------------------  Hypertension, follow-up  BP Readings from Last 3 Encounters:  09/13/20 (!) 148/89  07/23/20 (!) 154/96  06/14/20 (!) 147/74   Wt Readings from Last 3 Encounters:  09/13/20 178 lb (80.7 kg)  07/23/20 179 lb (81.2 kg)  06/14/20 179 lb (81.2 kg)     He was last seen for hypertension 3 months ago.  BP at that visit was 154/96. Management since that visit includes no medication changes. Continue to monitor BP at home.  He reports good compliance with treatment. He is not having side effects. none He is exercising. He is adherent to low salt diet.   Outside blood  pressures are not checking.  He does not smoke.  Use of agents associated with hypertension: none.       Medications: Outpatient Medications Prior to Visit  Medication Sig  . aspirin 81 MG tablet Take 81 mg by mouth daily.  . B Complex-C-Folic Acid (HM SUPER VITAMIN B COMPLEX/C PO) Take by mouth.  . Chromium-Cinnamon [phone] MCG-MG CAPS Take by mouth.  . Coenzyme Q10 (CO Q-10) 100 MG CAPS Take by mouth.  . ezetimibe (ZETIA) 10 MG tablet Take 1 tablet (10 mg total) by mouth daily.  . Garlic 1000 MG CAPS Take by mouth.  Marland Kitchen glucose blood (CONTOUR NEXT TEST) test strip Test twice daily and as needed  . losartan (COZAAR) 50 MG tablet Take 1 tablet (50 mg total) by mouth daily.  . metFORMIN (GLUCOPHAGE) 1000 MG tablet TAKE 1 TABLET BY MOUTH  TWICE DAILY WITH A MEAL  . Microlet Lancets MISC Use as directed.  . Misc Natural Products (DAILY HERBS PROSTATE PO) Take by mouth.  . Multiple Vitamins-Minerals (ZINC PO) Take by mouth.  . Omega-3 Fatty Acids (FISH OIL) 1000 MG CPDR Take by mouth.  . Omega-3 Fatty Acids (ULTRA OMEGA 3) 952 MG CAPS Take by mouth.  . Potassium Gluconate 550 MG TABS Take by mouth.  . Red Yeast Rice 600 MG TABS Take by mouth.  . sildenafil (VIAGRA) 50 MG tablet Take 1 tablet (50 mg total) by mouth as needed for erectile dysfunction.  Marland Kitchen  telmisartan (MICARDIS) 20 MG tablet Take 1 tablet (20 mg total) by mouth daily.  . vitamin E 1000 UNIT capsule Take 1,000 Units by mouth daily.  . Flaxseed, Linseed, (FLAXSEED OIL) 1200 MG CAPS Take by mouth. (Patient not taking: No sig reported)  . Vitamins-Lipotropics (LIPO-FLAVONOID PLUS PO) Take by mouth. (Patient not taking: No sig reported)   No facility-administered medications prior to visit.    Review of Systems      Objective    BP (!) 148/89 (BP Location: Left Arm, Patient Position: Sitting, Cuff Size: Large)   Pulse 60   Temp 98 F (36.7 C) (Oral)   Resp 16   Ht 5\' 5"  (1.651 m)   Wt 178 lb (80.7 kg)   SpO2 97%    BMI 29.62 kg/m  BP Readings from Last 3 Encounters:  09/13/20 (!) 148/89  07/23/20 (!) 154/96  06/14/20 (!) 147/74   Wt Readings from Last 3 Encounters:  09/13/20 178 lb (80.7 kg)  07/23/20 179 lb (81.2 kg)  06/14/20 179 lb (81.2 kg)       Physical Exam Vitals reviewed.  Constitutional:      Appearance: Normal appearance. He is well-developed and normal weight.  HENT:     Head: Normocephalic and atraumatic.     Right Ear: Tympanic membrane and external ear normal.     Left Ear: Tympanic membrane and external ear normal.     Nose: Nose normal.     Mouth/Throat:     Pharynx: Oropharynx is clear.  Eyes:     Conjunctiva/sclera: Conjunctivae normal.     Pupils: Pupils are equal, round, and reactive to light.  Cardiovascular:     Rate and Rhythm: Normal rate and regular rhythm.     Heart sounds: Normal heart sounds.  Pulmonary:     Effort: Pulmonary effort is normal.     Breath sounds: Normal breath sounds.  Abdominal:     General: Bowel sounds are normal.     Palpations: Abdomen is soft.  Musculoskeletal:     Cervical back: Normal range of motion and neck supple.  Skin:    General: Skin is warm and dry.  Neurological:     General: No focal deficit present.     Mental Status: He is alert and oriented to person, place, and time.  Psychiatric:        Mood and Affect: Mood normal.        Behavior: Behavior normal.        Thought Content: Thought content normal.        Judgment: Judgment normal.       No results found for any visits on 09/13/20.  Assessment & Plan     1. Type 2 diabetes mellitus without complication, without long-term current use of insulin (HCC) A1c on next visit.  2. Other hyperlipidemia   3. Primary hypertension Started amlodipine 5 mg daily. Increased losartan from 50 mg to 100 mg daily.  May need to investigate headache. - amLODipine (NORVASC) 5 MG tablet; Take 1 tablet (5 mg total) by mouth daily.  Dispense: 90 tablet; Refill: 0 -  losartan (COZAAR) 100 MG tablet; Take 1 tablet (100 mg total) by mouth daily.  Dispense: 90 tablet; Refill: 0   No follow-ups on file.      I, 11/13/20, MD, have reviewed all documentation for this visit. The documentation on 09/15/20 for the exam, diagnosis, procedures, and orders are all accurate and complete.    Kisa Fujii 11/15/20  Brooke Bonito, [place] [phone] (phone) [phone] (fax)  [place]

## 2020-10-21 ENCOUNTER — Ambulatory Visit: Payer: BC Managed Care – PPO | Admitting: Family Medicine

## 2020-10-21 ENCOUNTER — Other Ambulatory Visit: Payer: Self-pay

## 2020-10-21 ENCOUNTER — Encounter: Payer: Self-pay | Admitting: Family Medicine

## 2020-10-21 VITALS — BP 134/81 | HR 69 | Temp 98.0°F | Resp 16 | Ht 65.0 in | Wt 176.0 lb

## 2020-10-21 DIAGNOSIS — F419 Anxiety disorder, unspecified: Secondary | ICD-10-CM | POA: Diagnosis not present

## 2020-10-21 DIAGNOSIS — E7849 Other hyperlipidemia: Secondary | ICD-10-CM

## 2020-10-21 DIAGNOSIS — E119 Type 2 diabetes mellitus without complications: Secondary | ICD-10-CM | POA: Diagnosis not present

## 2020-10-21 DIAGNOSIS — I10 Essential (primary) hypertension: Secondary | ICD-10-CM | POA: Diagnosis not present

## 2020-10-21 LAB — POCT GLYCOSYLATED HEMOGLOBIN (HGB A1C): Hemoglobin A1C: 7.5 % — AB (ref 4.0–5.6)

## 2020-10-21 NOTE — Progress Notes (Signed)
Established patient visit   Patient: Jason Glenn   DOB: 12-18-1960   60 y.o. Male  MRN: [id number] Visit Date: 10/21/2020  Today's healthcare provider: [name], MD   No chief complaint on file.  Subjective    HPI  Patient comes in today for follow-up.  He has no complaints.  He is taking his medication as prescribed.  He is not watching diet or really getting any exercise.  He continues to work full-time. He states he has had no COVID vaccines and does not plan to get any . Diabetes Mellitus Type II, Follow-up  Lab Results  Component Value Date   HGBA1C 7.5 (A) 10/21/2020   HGBA1C 7.5 (H) 06/28/2020   HGBA1C 7.1 (A) 02/11/2020   Wt Readings from Last 3 Encounters:  10/21/20 176 lb (79.8 kg)  09/13/20 178 lb (80.7 kg)  07/23/20 179 lb (81.2 kg)   Last seen for diabetes 4 months ago.  Management since then includes no medication changes. He reports good compliance with treatment. He is not having side effects.  Symptoms: No fatigue No foot ulcerations  No appetite changes No nausea  No paresthesia of the feet  No polydipsia  No polyuria No visual disturbances   No vomiting     Home blood sugar records: fasting range: 130s  Episodes of hypoglycemia? No    Current insulin regiment: none Most Recent Eye Exam: due Current exercise: no regular exercise Current diet habits: well balanced  Pertinent Labs: Lab Results  Component Value Date   CHOL 216 (H) 06/28/2020   HDL 47 06/28/2020   LDLCALC 137 (H) 06/28/2020   TRIG 176 (H) 06/28/2020   CHOLHDL 4.6 06/25/2019   Lab Results  Component Value Date   NA 141 06/28/2020   K 5.3 (H) 06/28/2020   CREATININE 1.03 06/28/2020   GFRNONAA 77 06/28/2020   GFRAA 90 06/28/2020   GLUCOSE 158 (H) 06/28/2020     Hypertension, follow-up  BP Readings from Last 3 Encounters:  10/21/20 134/81  09/13/20 (!) 148/89  07/23/20 (!) 154/96   Wt Readings from Last 3 Encounters:  10/21/20 176 lb (79.8 kg)   09/13/20 178 lb (80.7 kg)  07/23/20 179 lb (81.2 kg)     He was last seen for hypertension 1 months ago.  BP at that visit was 148/89. Management since that visit includes starting amlodipine 5mg  and increasing losartan from 50mg  to 100mg  daily.  He reports fair compliance with treatment. Patient reports that he never started the amlodipine. He reports that he forgot to take his BP med (losartan) on last office visit. He reports that this was the reason why his BP was elevated. He reports that he is taking increased dose of Losartan.   He is not having side effects.  He is following a Regular diet. He is not exercising. He does not smoke.  Use of agents associated with hypertension: none.   Outside blood pressures are checked daily. Patient reports that it averages in the 130s/70s.  Symptoms: No chest pain No chest pressure  No palpitations No syncope  No dyspnea No orthopnea  No paroxysmal nocturnal dyspnea No lower extremity edema   Pertinent labs: Lab Results  Component Value Date   CHOL 216 (H) 06/28/2020   HDL 47 06/28/2020   LDLCALC 137 (H) 06/28/2020   TRIG 176 (H) 06/28/2020   CHOLHDL 4.6 06/25/2019   Lab Results  Component Value Date   NA 141 06/28/2020  K 5.3 (H) 06/28/2020   CREATININE 1.03 06/28/2020   GFRNONAA 77 06/28/2020   GFRAA 90 06/28/2020   GLUCOSE 158 (H) 06/28/2020     The 10-year ASCVD risk score Denman George DC Jr., et al., 2013) is: 25.4%        Medications: Outpatient Medications Prior to Visit  Medication Sig   aspirin 81 MG tablet Take 81 mg by mouth daily.   B Complex-C-Folic Acid (HM SUPER VITAMIN B COMPLEX/C PO) Take by mouth.   Chromium-Cinnamon 100-500 MCG-MG CAPS Take by mouth.   Coenzyme Q10 (CO Q-10) 200 MG CAPS Take by mouth.   ezetimibe (ZETIA) 10 MG tablet Take 1 tablet (10 mg total) by mouth daily.   Garlic 1000 MG CAPS Take by mouth.   glucose blood (CONTOUR NEXT TEST) test strip Test twice daily and as needed   losartan  (COZAAR) 100 MG tablet Take 1 tablet (100 mg total) by mouth daily.   metFORMIN (GLUCOPHAGE) 1000 MG tablet TAKE 1 TABLET BY MOUTH  TWICE DAILY WITH A MEAL   Microlet Lancets MISC Use as directed.   Misc Natural Products (DAILY HERBS PROSTATE PO) Take by mouth.   Multiple Vitamins-Minerals (ZINC PO) Take by mouth.   amLODipine (NORVASC) 5 MG tablet Take 1 tablet (5 mg total) by mouth daily. (Patient not taking: Reported on 10/21/2020)   Flaxseed, Linseed, (FLAXSEED OIL) 1200 MG CAPS Take by mouth.   Omega-3 Fatty Acids (FISH OIL) 1000 MG CPDR Take by mouth.   Omega-3 Fatty Acids (ULTRA OMEGA 3) 952 MG CAPS Take by mouth.   Potassium Gluconate 550 MG TABS Take by mouth.   Red Yeast Rice 600 MG TABS Take by mouth.   sildenafil (VIAGRA) 50 MG tablet Take 1 tablet (50 mg total) by mouth as needed for erectile dysfunction.   telmisartan (MICARDIS) 20 MG tablet Take 1 tablet (20 mg total) by mouth daily.   vitamin E 1000 UNIT capsule Take 1,000 Units by mouth daily.   Vitamins-Lipotropics (LIPO-FLAVONOID PLUS PO) Take by mouth. (Patient not taking: No sig reported)   No facility-administered medications prior to visit.    Review of Systems      Objective    BP 134/81   Pulse 69   Temp 98 F (36.7 C)   Resp 16   Ht 5\' 5"  (1.651 m)   Wt 176 lb (79.8 kg)   BMI 29.29 kg/m      Physical Exam Vitals reviewed.  Constitutional:      Appearance: Normal appearance. He is well-developed and normal weight.  HENT:     Head: Normocephalic and atraumatic.     Right Ear: Tympanic membrane and external ear normal.     Left Ear: Tympanic membrane and external ear normal.     Nose: Nose normal.     Mouth/Throat:     Pharynx: Oropharynx is clear.  Eyes:     Conjunctiva/sclera: Conjunctivae normal.     Pupils: Pupils are equal, round, and reactive to light.  Cardiovascular:     Rate and Rhythm: Normal rate and regular rhythm.     Heart sounds: Normal heart sounds.  Pulmonary:     Effort:  Pulmonary effort is normal.     Breath sounds: Normal breath sounds.  Abdominal:     General: Bowel sounds are normal.     Palpations: Abdomen is soft.  Musculoskeletal:     Cervical back: Normal range of motion and neck supple.  Skin:    General: Skin  is warm and dry.  Neurological:     General: No focal deficit present.     Mental Status: He is alert and oriented to person, place, and time.  Psychiatric:        Mood and Affect: Mood normal.        Behavior: Behavior normal.        Thought Content: Thought content normal.        Judgment: Judgment normal.      Results for orders placed or performed in visit on 10/21/20  POCT glycosylated hemoglobin (Hb A1C)  Result Value Ref Range   Hemoglobin A1C 7.5 (A) 4.0 - 5.6 %   HbA1c POC (<> result, manual entry)     HbA1c, POC (prediabetic range)     HbA1c, POC (controlled diabetic range)      Assessment & Plan     1. Type 2 diabetes mellitus without complication, without long-term current use of insulin (HCC) A1c stable at 7.5 but needs better control.  Diet and exercise stressed along with weight loss.  He wishes to work on this rather than add medications.  On metformin. - POCT glycosylated hemoglobin (Hb A1C)  2. Other hyperlipidemia Patient on Zetia.  He states he is totally intolerant of statins with myalgias.  3. Primary hypertension Patient on amlodipine and ARB  4. Anxiety Stable   No follow-ups on file.      I, [name], MD, have reviewed all documentation for this visit. The documentation on 10/24/20 for the exam, diagnosis, procedures, and orders are all accurate and complete.    Diahn Waidelich Wendelyn Breslow, MD  [facility] [phone] (phone) [phone] (fax)  [facility]

## 2020-11-14 ENCOUNTER — Other Ambulatory Visit: Payer: Self-pay | Admitting: Family Medicine

## 2020-11-14 DIAGNOSIS — I10 Essential (primary) hypertension: Secondary | ICD-10-CM

## 2020-11-14 NOTE — Telephone Encounter (Signed)
last RF 09/13/20 #90 for both requested meds- refused// too soon

## 2020-12-01 ENCOUNTER — Other Ambulatory Visit: Payer: Self-pay | Admitting: Family Medicine

## 2020-12-01 DIAGNOSIS — I10 Essential (primary) hypertension: Secondary | ICD-10-CM

## 2020-12-01 NOTE — Telephone Encounter (Signed)
Requested Prescriptions  Pending Prescriptions Disp Refills  . amLODipine (NORVASC) 5 MG tablet [Pharmacy Med Name: amLODIPine Besylate 5 MG Oral Tablet] 90 tablet 1    Sig: TAKE 1 TABLET BY MOUTH  DAILY     Cardiovascular:  Calcium Channel Blockers Passed - 12/01/2020  9:01 PM      Passed - Last BP in normal range    BP Readings from Last 1 Encounters:  10/21/20 134/81         Passed - Valid encounter within last 6 months    Recent Outpatient Visits          1 month ago Type 2 diabetes mellitus without complication, without long-term current use of insulin (HCC)   [facility] Maple Hudson., MD   2 months ago Type 2 diabetes mellitus without complication, without long-term current use of insulin (HCC)   [facility] Maple Hudson., MD   5 months ago Type 2 diabetes mellitus without complication, without long-term current use of insulin (HCC)   [facility] Maple Hudson., MD   9 months ago Type 2 diabetes mellitus without complication, without long-term current use of insulin (HCC)   [facility] Maple Hudson., MD   1 year ago Type 2 diabetes mellitus without complication, without long-term current use of insulin [facility])   [facility] Maple Hudson., MD      Future Appointments            In 3 months Maple Hudson., MD [facility]           . losartan (COZAAR) 100 MG tablet [Pharmacy Med Name: Losartan Potassium 100 MG Oral Tablet] 90 tablet 1    Sig: TAKE 1 TABLET BY MOUTH  DAILY     Cardiovascular:  Angiotensin Receptor Blockers Failed - 12/01/2020  9:01 PM      Failed - K in normal range and within 180 days    Potassium  Date Value Ref Range Status  06/28/2020 5.3 (H) 3.5 - 5.2 mmol/L Final         Passed - Cr in normal range and within 180 days    Creatinine, Ser  Date Value Ref Range Status  06/28/2020 1.03 0.76 - 1.27 mg/dL  Final   Creatinine, POC  Date Value Ref Range Status  02/14/2018 NA mg/dL Final         Passed - Patient is not pregnant      Passed - Last BP in normal range    BP Readings from Last 1 Encounters:  10/21/20 134/81         Passed - Valid encounter within last 6 months    Recent Outpatient Visits          1 month ago Type 2 diabetes mellitus without complication, without long-term current use of insulin [facility])   [facility] Maple Hudson., MD   2 months ago Type 2 diabetes mellitus without complication, without long-term current use of insulin [facility])   [facility] Maple Hudson., MD   5 months ago Type 2 diabetes mellitus without complication, without long-term current use of insulin [facility])   [facility] Maple Hudson., MD   9 months ago Type 2 diabetes mellitus without complication, without long-term current use of insulin [facility])   [facility] Maple Hudson., MD   1 year ago  Type 2 diabetes mellitus without complication, without long-term current use of insulin [facility])   [facility] Maple Hudson., MD      Future Appointments            In 3 months Maple Hudson., MD [facility], [facility]

## 2021-02-08 DIAGNOSIS — X32XXXA Exposure to sunlight, initial encounter: Secondary | ICD-10-CM | POA: Diagnosis not present

## 2021-02-08 DIAGNOSIS — D485 Neoplasm of uncertain behavior of skin: Secondary | ICD-10-CM | POA: Diagnosis not present

## 2021-02-08 DIAGNOSIS — C44329 Squamous cell carcinoma of skin of other parts of face: Secondary | ICD-10-CM | POA: Diagnosis not present

## 2021-02-08 DIAGNOSIS — D0439 Carcinoma in situ of skin of other parts of face: Secondary | ICD-10-CM | POA: Diagnosis not present

## 2021-02-08 DIAGNOSIS — D2271 Melanocytic nevi of right lower limb, including hip: Secondary | ICD-10-CM | POA: Diagnosis not present

## 2021-02-08 DIAGNOSIS — Z85828 Personal history of other malignant neoplasm of skin: Secondary | ICD-10-CM | POA: Diagnosis not present

## 2021-02-08 DIAGNOSIS — D2261 Melanocytic nevi of right upper limb, including shoulder: Secondary | ICD-10-CM | POA: Diagnosis not present

## 2021-02-08 DIAGNOSIS — L570 Actinic keratosis: Secondary | ICD-10-CM | POA: Diagnosis not present

## 2021-02-08 DIAGNOSIS — D225 Melanocytic nevi of trunk: Secondary | ICD-10-CM | POA: Diagnosis not present

## 2021-03-07 ENCOUNTER — Encounter: Payer: Self-pay | Admitting: Family Medicine

## 2021-03-07 ENCOUNTER — Ambulatory Visit (INDEPENDENT_AMBULATORY_CARE_PROVIDER_SITE_OTHER): Payer: BC Managed Care – PPO | Admitting: Family Medicine

## 2021-03-07 ENCOUNTER — Other Ambulatory Visit: Payer: Self-pay

## 2021-03-07 VITALS — BP 148/71 | HR 66 | Temp 97.9°F | Resp 16 | Ht 65.0 in | Wt 174.0 lb

## 2021-03-07 DIAGNOSIS — F419 Anxiety disorder, unspecified: Secondary | ICD-10-CM

## 2021-03-07 DIAGNOSIS — Z23 Encounter for immunization: Secondary | ICD-10-CM

## 2021-03-07 DIAGNOSIS — H9313 Tinnitus, bilateral: Secondary | ICD-10-CM

## 2021-03-07 DIAGNOSIS — H9193 Unspecified hearing loss, bilateral: Secondary | ICD-10-CM

## 2021-03-07 DIAGNOSIS — Z0000 Encounter for general adult medical examination without abnormal findings: Secondary | ICD-10-CM

## 2021-03-07 DIAGNOSIS — N529 Male erectile dysfunction, unspecified: Secondary | ICD-10-CM

## 2021-03-07 DIAGNOSIS — L570 Actinic keratosis: Secondary | ICD-10-CM

## 2021-03-07 DIAGNOSIS — G729 Myopathy, unspecified: Secondary | ICD-10-CM

## 2021-03-07 DIAGNOSIS — E119 Type 2 diabetes mellitus without complications: Secondary | ICD-10-CM | POA: Diagnosis not present

## 2021-03-07 DIAGNOSIS — Z125 Encounter for screening for malignant neoplasm of prostate: Secondary | ICD-10-CM

## 2021-03-07 DIAGNOSIS — E7800 Pure hypercholesterolemia, unspecified: Secondary | ICD-10-CM

## 2021-03-07 DIAGNOSIS — G5603 Carpal tunnel syndrome, bilateral upper limbs: Secondary | ICD-10-CM

## 2021-03-07 DIAGNOSIS — M720 Palmar fascial fibromatosis [Dupuytren]: Secondary | ICD-10-CM

## 2021-03-07 DIAGNOSIS — R196 Halitosis: Secondary | ICD-10-CM

## 2021-03-07 DIAGNOSIS — I10 Essential (primary) hypertension: Secondary | ICD-10-CM | POA: Diagnosis not present

## 2021-03-07 MED ORDER — SILDENAFIL CITRATE 50 MG PO TABS: 50.0000 mg | ORAL_TABLET | ORAL | 5 refills | Status: AC | PRN

## 2021-03-07 MED ORDER — FLUTICASONE PROPIONATE 50 MCG/ACT NA SUSP: 2.0000 | Freq: Every day | NASAL | 6 refills | Status: AC

## 2021-03-07 NOTE — Patient Instructions (Signed)
Try wearing cock-up wrist splint at night.

## 2021-03-07 NOTE — Progress Notes (Signed)
I,[name],acting as a scribe for [name], MD.,have documented all relevant documentation on the behalf of [name], MD,as directed by  [name], MD while in the presence of [name], MD.  Complete physical exam   Patient: Jason Glenn   DOB: 20-Oct-1960   60 y.o. Male  MRN: [id number] Visit Date: 03/07/2021  Today's healthcare provider: [name], MD   Chief Complaint  Patient presents with   Annual Exam   Subjective    Jason Glenn is a 60 y.o. male who presents today for a complete physical exam.  He reports consuming a general diet. Home exercise routine includes walking. He generally feels well. He reports sleeping well. He does not have additional problems to discuss today.  Overall he is feeling fairly well.  He continues to work full-time. HPI  Contractures of both ring fingers and symptoms most consistent with carpal tunnel syndrome He has nasal congestion and "bad breath".  He has had no other symptoms of any chronic sinusitis.  He may have some reflux symptoms. Does have decreased hearing that has become an issue with no dizziness associated with it. He is taking medications as prescribed.  Past Medical History:  Diagnosis Date   Anxiety    Diabetes mellitus without complication (HCC)    Hyperlipemia    Hyperlipidemia    Hypertension    Past Surgical History:  Procedure Laterality Date   COLONOSCOPY     COLONOSCOPY WITH PROPOFOL N/A 07/23/2020   Procedure: COLONOSCOPY WITH PROPOFOL;  Surgeon: Pasty Spillers, MD;  Location: [facility] ENDOSCOPY;  Service: Endoscopy;  Laterality: N/A;   POLYPECTOMY     tubular adenoma from colon   Social History   Socioeconomic History   Marital status: Married    Spouse name: Not on file   Number of children: Not on file   Years of education: Not on file   Highest education level: Not on file  Occupational History   Not on file  Tobacco Use   Smoking status:  Never   Smokeless tobacco: Never  Vaping Use   Vaping Use: Never used  Substance and Sexual Activity   Alcohol use: No   Drug use: No   Sexual activity: Yes  Other Topics Concern   Not on file  Social History Narrative   Not on file   Social Determinants of Health   Financial Resource Strain: Not on file  Food Insecurity: Not on file  Transportation Needs: Not on file  Physical Activity: Not on file  Stress: Not on file  Social Connections: Not on file  Intimate Partner Violence: Not on file   Family Status  Relation Name Status   Mother  Alive   Father  Deceased at age 41   Sister  Alive   Brother  Alive   Daughter  Alive   Son  Alive   Sister  Alive   Family History  Problem Relation Age of Onset   Cancer Mother        type of salivary vs carotid gland cancer threated wuth chemotherapy   CAD Father    Leukemia Father    Allergies  Allergen Reactions   Pravastatin Sodium     Other reaction(s): Muscle Cramps    Patient Care Team: Maple Hudson., MD as PCP - General (Family Medicine)   Medications: Outpatient Medications Prior to Visit  Medication Sig   aspirin 81 MG tablet Take  81 mg by mouth daily.   B Complex-C-Folic Acid (HM SUPER VITAMIN B COMPLEX/C PO) Take by mouth.   Chromium-Cinnamon 100-500 MCG-MG CAPS Take by mouth.   Coenzyme Q10 (CO Q-10) 200 MG CAPS Take by mouth.   ezetimibe (ZETIA) 10 MG tablet Take 1 tablet (10 mg total) by mouth daily.   Garlic 1000 MG CAPS Take by mouth.   glucose blood (CONTOUR NEXT TEST) test strip Test twice daily and as needed (Patient taking differently: Test once daily and as needed)   losartan (COZAAR) 100 MG tablet TAKE 1 TABLET BY MOUTH  DAILY   metFORMIN (GLUCOPHAGE) 1000 MG tablet TAKE 1 TABLET BY MOUTH  TWICE DAILY WITH A MEAL   Microlet Lancets MISC Use as directed.   Misc Natural Products (DAILY HERBS PROSTATE PO) Take by mouth.   Multiple Vitamins-Minerals (ZINC PO) Take by mouth.   Omega-3 Fatty  Acids (ULTRA OMEGA 3) 952 MG CAPS Take 1,400 mg by mouth.   Potassium Gluconate 550 MG TABS Take 99 mg by mouth in the morning and at bedtime.   Red Yeast Rice 600 MG TABS Take by mouth.   sildenafil (VIAGRA) 50 MG tablet Take 1 tablet (50 mg total) by mouth as needed for erectile dysfunction.   vitamin E 1000 UNIT capsule Take 1,000 Units by mouth daily.   Zinc 50 MG CAPS Take by mouth.   [DISCONTINUED] amLODipine (NORVASC) 5 MG tablet TAKE 1 TABLET BY MOUTH  DAILY (Patient not taking: Reported on 03/07/2021)   [DISCONTINUED] Flaxseed, Linseed, (FLAXSEED OIL) 1200 MG CAPS Take by mouth.   [DISCONTINUED] Omega-3 Fatty Acids (FISH OIL) 1000 MG CPDR Take by mouth.   [DISCONTINUED] telmisartan (MICARDIS) 20 MG tablet Take 1 tablet (20 mg total) by mouth daily. (Patient not taking: Reported on 03/07/2021)   [DISCONTINUED] Vitamins-Lipotropics (LIPO-FLAVONOID PLUS PO) Take by mouth. (Patient not taking: No sig reported)   No facility-administered medications prior to visit.    Review of Systems  All other systems reviewed and are negative.  Last hemoglobin A1c Lab Results  Component Value Date   HGBA1C 7.8 (H) 03/11/2021      Objective    BP (!) 148/71 (BP Location: Left Arm, Patient Position: Sitting, Cuff Size: Large)   Pulse 66   Temp 97.9 F (36.6 C) (Temporal)   Resp 16   Ht 5\' 5"  (1.651 m)   Wt 174 lb (78.9 kg)   SpO2 97%   BMI 28.96 kg/m  BP Readings from Last 3 Encounters:  03/07/21 (!) 148/71  10/21/20 134/81  09/13/20 (!) 148/89   Wt Readings from Last 3 Encounters:  03/07/21 174 lb (78.9 kg)  10/21/20 176 lb (79.8 kg)  09/13/20 178 lb (80.7 kg)      Physical Exam Vitals reviewed.  Constitutional:      Appearance: Normal appearance. He is well-developed and normal weight.  HENT:     Head: Normocephalic and atraumatic.     Right Ear: Tympanic membrane and external ear normal.     Left Ear: Tympanic membrane and external ear normal.     Nose: Nose normal.      Mouth/Throat:     Pharynx: Oropharynx is clear.  Eyes:     Conjunctiva/sclera: Conjunctivae normal.     Pupils: Pupils are equal, round, and reactive to light.  Cardiovascular:     Rate and Rhythm: Normal rate and regular rhythm.     Heart sounds: Normal heart sounds.  Pulmonary:     Effort:  Pulmonary effort is normal.     Breath sounds: Normal breath sounds.  Abdominal:     General: Bowel sounds are normal.     Palpations: Abdomen is soft.  Genitourinary:    Penis: Normal.      Testes: Normal.  Musculoskeletal:     Cervical back: Normal range of motion and neck supple.     Comments: He has some early Dupuytren's contracture of both ring fingers. He has a mildly positive Tinel's sign on both sides.  Skin:    General: Skin is warm and dry.  Neurological:     General: No focal deficit present.     Mental Status: He is alert and oriented to person, place, and time. Mental status is at baseline.  Psychiatric:        Mood and Affect: Mood normal.        Behavior: Behavior normal.        Thought Content: Thought content normal.        Judgment: Judgment normal.      Last depression screening scores PHQ 2/9 Scores 03/07/2021 06/14/2020 06/11/2019  PHQ - 2 Score 0 0 0  PHQ- 9 Score 0 - 0   Last fall risk screening Fall Risk  03/07/2021  Falls in the past year? 0  Number falls in past yr: 0  Injury with Fall? 0  Risk for fall due to : No Fall Risks  Follow up Falls evaluation completed   Last Audit-C alcohol use screening Alcohol Use Disorder Test (AUDIT) 03/07/2021  1. How often do you have a drink containing alcohol? 0  2. How many drinks containing alcohol do you have on a typical day when you are drinking? 0  3. How often do you have six or more drinks on one occasion? 0  AUDIT-C Score 0  Alcohol Brief Interventions/Follow-up -   A score of 3 or more in women, and 4 or more in men indicates increased risk for alcohol abuse, EXCEPT if all of the points are from  question 1   No results found for any visits on 03/07/21.  Assessment & Plan    Routine Health Maintenance and Physical Exam  Exercise Activities and Dietary recommendations  Goals   None     Immunization History  Administered Date(s) Administered   Hepatitis B, adult 08/08/2016   Influenza,inj,Quad PF,6+ Mos 02/14/2018, 03/11/2019, 02/11/2020   Pneumococcal Polysaccharide-23 08/08/2016   Td 12/02/2003, 12/09/2012    Health Maintenance  Topic Date Due   FOOT EXAM  Never done   HIV Screening  Never done   Zoster Vaccines- Shingrix (1 of 2) Never done   Pneumococcal Vaccine 78-66 Years old (2 - PCV) 08/08/2017   OPHTHALMOLOGY EXAM  12/02/2019   INFLUENZA VACCINE  12/13/2020   COVID-19 Vaccine (1) 09/29/2021 (Originally 11/15/1960)   HEMOGLOBIN A1C  04/22/2021   TETANUS/TDAP  12/10/2022   COLONOSCOPY (Pts 45-35yrs Insurance coverage will need to be confirmed)  07/24/2030   Hepatitis C Screening  Completed   HPV VACCINES  Aged Out    Discussed health benefits of physical activity, and encouraged him to engage in regular exercise appropriate for his age and condition.  1. Annual physical exam  - Lipid panel - TSH - CBC w/Diff/Platelet - Comprehensive Metabolic Panel (CMET) - Hemoglobin A1c  2. Essential hypertension Good control on loaded pain and losartan.  Will need to losartan to telmisartan 20 mg and see if he gets better control as we can go up on  the dose also. - Lipid panel - TSH - CBC w/Diff/Platelet - Comprehensive Metabolic Panel (CMET) - Hemoglobin A1c  3. Pure hypercholesterolemia Patient is on Zetia.  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 Type 2 diabetes mellitus without complication, without long-term current use of insulin (HCC) Controlled on metformin.  Diet and exercise stressed. - Lipid panel - TSH - CBC w/Diff/Platelet - Comprehensive  Metabolic Panel (CMET) - Hemoglobin A1c  5. Screening for prostate cancer  - PSA  6. 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  Dispense: 6 tablet; Refill: 5  8. Decreased hearing of both ears  - Ambulatory referral to ENT - fluticasone (FLONASE) 50 MCG/ACT nasal spray; Place 2 sprays into both nostrils daily.  Dispense: 16 g; Refill: 6 - sildenafil (VIAGRA) 50 MG tablet; Take 1 tablet (50 mg total) by mouth as needed for erectile dysfunction.  Dispense: 6 tablet; Refill: 5  9. Ringing in ears, bilateral Refer to ENT. - Ambulatory referral to ENT - fluticasone (FLONASE) 50 MCG/ACT nasal spray; Place 2 sprays into both nostrils daily.  Dispense: 16 g; Refill: 6 - sildenafil (VIAGRA) 50 MG tablet; Take 1 tablet (50 mg total) by mouth as needed for erectile dysfunction.  Dispense: 6 tablet; Refill: 5  10. Halitosis ENT referral done.  May in fact be due to reflux.  Consider PPI.  11. Solar keratosis To dermatology  12. Bilateral carpal tunnel syndrome Try cock up wrist splint.  13. Dupuytren's contracture of both hands For to hand surgeon in the future for this.   No follow-ups on file.     I, [name], MD, have reviewed all documentation for this visit. The documentation on 03/12/21 for the exam, diagnosis, procedures, and orders are all accurate and complete.    Rodric Punch Wendelyn Breslow, MD  [facility] [phone] (phone) [phone] (fax)  [facility]

## 2021-03-11 DIAGNOSIS — E7800 Pure hypercholesterolemia, unspecified: Secondary | ICD-10-CM | POA: Diagnosis not present

## 2021-03-11 DIAGNOSIS — Z0000 Encounter for general adult medical examination without abnormal findings: Secondary | ICD-10-CM | POA: Diagnosis not present

## 2021-03-11 DIAGNOSIS — I10 Essential (primary) hypertension: Secondary | ICD-10-CM | POA: Diagnosis not present

## 2021-03-11 DIAGNOSIS — E119 Type 2 diabetes mellitus without complications: Secondary | ICD-10-CM | POA: Diagnosis not present

## 2021-03-11 DIAGNOSIS — Z125 Encounter for screening for malignant neoplasm of prostate: Secondary | ICD-10-CM | POA: Diagnosis not present

## 2021-03-12 LAB — COMPREHENSIVE METABOLIC PANEL
ALT: 34 IU/L (ref 0–44)
AST: 21 IU/L (ref 0–40)
Albumin/Globulin Ratio: 1.8 (ref 1.2–2.2)
Albumin: 4.6 g/dL (ref 3.8–4.8)
Alkaline Phosphatase: 72 IU/L (ref 44–121)
BUN/Creatinine Ratio: 16 (ref 10–24)
BUN: 16 mg/dL (ref 8–27)
Bilirubin Total: 0.5 mg/dL (ref 0.0–1.2)
CO2: 24 mmol/L (ref 20–29)
Calcium: 9.6 mg/dL (ref 8.6–10.2)
Chloride: 100 mmol/L (ref 96–106)
Creatinine, Ser: 1.01 mg/dL (ref 0.76–1.27)
Globulin, Total: 2.5 g/dL (ref 1.5–4.5)
Glucose: 155 mg/dL — ABNORMAL HIGH (ref 70–99)
Potassium: 5.1 mmol/L (ref 3.5–5.2)
Sodium: 140 mmol/L (ref 134–144)
Total Protein: 7.1 g/dL (ref 6.0–8.5)
eGFR: 84 mL/min/{1.73_m2} (ref >59)

## 2021-03-12 LAB — CBC WITH DIFFERENTIAL/PLATELET
Basophils Absolute: 0 10*3/uL (ref 0.0–0.2)
Basos: 1 %
EOS (ABSOLUTE): 0.3 10*3/uL (ref 0.0–0.4)
Eos: 4 %
Hematocrit: 41.1 % (ref 37.5–51.0)
Hemoglobin: 14 g/dL (ref 13.0–17.7)
Immature Grans (Abs): 0 10*3/uL (ref 0.0–0.1)
Immature Granulocytes: 0 %
Lymphocytes Absolute: 2 10*3/uL (ref 0.7–3.1)
Lymphs: 34 %
MCH: 30.4 pg (ref 26.6–33.0)
MCHC: 34.1 g/dL (ref 31.5–35.7)
MCV: 89 fL (ref 79–97)
Monocytes Absolute: 0.4 10*3/uL (ref 0.1–0.9)
Monocytes: 7 %
Neutrophils Absolute: 3.1 10*3/uL (ref 1.4–7.0)
Neutrophils: 54 %
Platelets: 277 10*3/uL (ref 150–450)
RBC: 4.61 x10E6/uL (ref 4.14–5.80)
RDW: 12.4 % (ref 11.6–15.4)
WBC: 5.8 10*3/uL (ref 3.4–10.8)

## 2021-03-12 LAB — LIPID PANEL
Chol/HDL Ratio: 4.1 ratio (ref 0.0–5.0)
Cholesterol, Total: 194 mg/dL (ref 100–199)
HDL: 47 mg/dL (ref >39)
LDL Chol Calc (NIH): 122 mg/dL — ABNORMAL HIGH (ref 0–99)
Triglycerides: 142 mg/dL (ref 0–149)
VLDL Cholesterol Cal: 25 mg/dL (ref 5–40)

## 2021-03-12 LAB — TSH: TSH: 3.08 u[IU]/mL (ref 0.450–4.500)

## 2021-03-12 LAB — PSA: Prostate Specific Ag, Serum: 0.5 ng/mL (ref 0.0–4.0)

## 2021-03-12 LAB — HEMOGLOBIN A1C
Est. average glucose Bld gHb Est-mCnc: 177 mg/dL
Hgb A1c MFr Bld: 7.8 % — ABNORMAL HIGH (ref 4.8–5.6)

## 2021-03-16 DIAGNOSIS — L821 Other seborrheic keratosis: Secondary | ICD-10-CM | POA: Diagnosis not present

## 2021-03-16 DIAGNOSIS — C44329 Squamous cell carcinoma of skin of other parts of face: Secondary | ICD-10-CM | POA: Diagnosis not present

## 2021-03-23 DIAGNOSIS — Z4802 Encounter for removal of sutures: Secondary | ICD-10-CM | POA: Diagnosis not present

## 2021-03-28 ENCOUNTER — Telehealth: Payer: Self-pay | Admitting: *Deleted

## 2021-03-28 NOTE — Telephone Encounter (Signed)
Reviewed lab results and physician's note with the patient. No further questions asked.

## 2021-04-05 ENCOUNTER — Other Ambulatory Visit: Payer: Self-pay | Admitting: Family Medicine

## 2021-04-05 DIAGNOSIS — E119 Type 2 diabetes mellitus without complications: Secondary | ICD-10-CM

## 2021-04-06 NOTE — Telephone Encounter (Signed)
Requested Prescriptions  Pending Prescriptions Disp Refills  . glucose blood (CONTOUR NEXT TEST) test strip [Pharmacy Med Name: Contour Next Test In Vitro Strip] 200 strip 3    Sig: USE TO TEST BLOOD SUGAR  TWICE DAILY AS NEEDED     Endocrinology: Diabetes - Testing Supplies Passed - 04/05/2021  4:54 PM      Passed - Valid encounter within last 12 months    Recent Outpatient Visits          1 month ago Annual physical exam   [facility] Maple Hudson., MD   5 months ago Type 2 diabetes mellitus without complication, without long-term current use of insulin [facility])   [facility] Maple Hudson., MD   6 months ago Type 2 diabetes mellitus without complication, without long-term current use of insulin [facility])   [facility] Maple Hudson., MD   9 months ago Type 2 diabetes mellitus without complication, without long-term current use of insulin [facility])   [facility] Maple Hudson., MD   1 year ago Type 2 diabetes mellitus without complication, without long-term current use of insulin [facility])   [facility] Maple Hudson., MD      Future Appointments            In 4 months Maple Hudson., MD [facility]

## 2021-05-30 DIAGNOSIS — Z8616 Personal history of COVID-19: Secondary | ICD-10-CM | POA: Diagnosis not present

## 2021-05-30 DIAGNOSIS — H9313 Tinnitus, bilateral: Secondary | ICD-10-CM | POA: Diagnosis not present

## 2021-05-30 DIAGNOSIS — H903 Sensorineural hearing loss, bilateral: Secondary | ICD-10-CM | POA: Diagnosis not present

## 2021-06-10 ENCOUNTER — Other Ambulatory Visit: Payer: Self-pay | Admitting: Family Medicine

## 2021-06-10 DIAGNOSIS — I10 Essential (primary) hypertension: Secondary | ICD-10-CM

## 2021-06-10 NOTE — Telephone Encounter (Signed)
Requested Prescriptions  Pending Prescriptions Disp Refills   losartan (COZAAR) 100 MG tablet [Pharmacy Med Name: Losartan Potassium 100 MG Oral Tablet] 90 tablet 1    Sig: TAKE 1 TABLET BY MOUTH  DAILY     Cardiovascular:  Angiotensin Receptor Blockers Failed - 06/10/2021  3:58 PM      Failed - Last BP in normal range    BP Readings from Last 1 Encounters:  03/07/21 (!) 148/71         Passed - Cr in normal range and within 180 days    Creatinine, Ser  Date Value Ref Range Status  03/11/2021 1.01 0.76 - 1.27 mg/dL Final   Creatinine, POC  Date Value Ref Range Status  02/14/2018 NA mg/dL Final         Passed - K in normal range and within 180 days    Potassium  Date Value Ref Range Status  03/11/2021 5.1 3.5 - 5.2 mmol/L Final         Passed - Patient is not pregnant      Passed - Valid encounter within last 6 months    Recent Outpatient Visits          3 months ago Annual physical exam   [facility] Maple Hudson., MD   7 months ago Type 2 diabetes mellitus without complication, without long-term current use of insulin [facility])   [facility] Maple Hudson., MD   9 months ago Type 2 diabetes mellitus without complication, without long-term current use of insulin [facility])   [facility] Maple Hudson., MD   12 months ago Type 2 diabetes mellitus without complication, without long-term current use of insulin [facility])   [facility] Maple Hudson., MD   1 year ago Type 2 diabetes mellitus without complication, without long-term current use of insulin [facility])   [facility] Maple Hudson., MD      Future Appointments            In 1 month Maple Hudson., MD [facility]

## 2021-08-08 ENCOUNTER — Ambulatory Visit: Payer: BC Managed Care – PPO | Admitting: Family Medicine

## 2021-08-08 DIAGNOSIS — D225 Melanocytic nevi of trunk: Secondary | ICD-10-CM | POA: Diagnosis not present

## 2021-08-08 DIAGNOSIS — L400 Psoriasis vulgaris: Secondary | ICD-10-CM | POA: Diagnosis not present

## 2021-08-08 DIAGNOSIS — L821 Other seborrheic keratosis: Secondary | ICD-10-CM | POA: Diagnosis not present

## 2021-08-08 DIAGNOSIS — L570 Actinic keratosis: Secondary | ICD-10-CM | POA: Diagnosis not present

## 2021-08-08 DIAGNOSIS — Z85828 Personal history of other malignant neoplasm of skin: Secondary | ICD-10-CM | POA: Diagnosis not present

## 2021-08-10 NOTE — Progress Notes (Signed)
?  ? ?Jason Glenn,Jason Glenn,acting as a scribe for Wilhemena Durie, MD.,have documented all relevant documentation on the behalf of Wilhemena Durie, MD,as directed by  Wilhemena Durie, MD while in the presence of Wilhemena Durie, MD. ? ? ?Established patient visit ? ? ?Patient: Jason Glenn   DOB: 08/12/1960   61 y.o. Male  MRN: [id number] ?Visit Date: 08/11/2021 ? ?Today's healthcare provider: Wilhemena Durie, MD  ? ?Chief Complaint  ?Patient presents with  ? Diabetes  ? Hypertension  ? ?Subjective  ?  ?HPI  ?Patient comes in today for follow-up.  He feels fairly well.  He is not watching his diet nor is he exercising.  He is taking his medications.  His only new issue is 1 of a frontal headache.  No neurologic symptoms at all.  It is across his forehead and a headband distribution.  No temporal artery tenderness no jaw claudication no visual disturbance.  No focal neurologic symptoms at all. ?Diabetes Mellitus Type II, follow-up ? ?Lab Results  ?Component Value Date  ? HGBA1C 7.8 (A) 08/11/2021  ? HGBA1C 7.8 (H) 03/11/2021  ? HGBA1C 7.5 (A) 10/21/2020  ? ?Last seen for diabetes 5 months ago.  ?Management since then includes continuing the same treatment. ?He reports excellent compliance with treatment. ?He is not having side effects.  ? ?Home blood sugar records: fasting range: 160-170s ? ?Episodes of hypoglycemia? No  ?  ?Current insulin regiment: none ?Most Recent Eye Exam: UTD ? ?--------------------------------------------------------------------------------------------------- ?Hypertension, follow-up ? ?BP Readings from Last 3 Encounters:  ?08/11/21 120/72  ?03/07/21 (!) 148/71  ?10/21/20 134/81  ? Wt Readings from Last 3 Encounters:  ?08/11/21 174 lb 9.6 oz (79.2 kg)  ?03/07/21 174 lb (78.9 kg)  ?10/21/20 176 lb (79.8 kg)  ?  ? ?He was last seen for hypertension 5 months ago.  ?BP at that visit was 148/71. ?Management since that visit includes; Good control on loaded pain and losartan.  Will need  to losartan to telmisartan 20 mg and see if he gets better control as we can go up on the dose also. ?He reports excellent compliance with treatment. ?He is not having side effects.  ?He is not exercising. ?He is adherent to low salt diet.   ?Outside blood pressures are not being checked. ? ?He does not smoke. ? ?Use of agents associated with hypertension: none.  ? ?--------------------------------------------------------------------------------------------------- ?Patient C/O headache on and off x several months. Patient reports headache can last all day. And he reports headaches several days out of the week. Patient reports pain is like pressure all across his forehead and feeling off balance. He denies any chest pain, shortness of breath, visual changes, nausea or vomiting. Patient reports he saw ENT about three months ago and they suggested to go to neurology, which he refused at that time. Patient reports he does not take any OTC medications for pain.  ? ?Medications: ?Outpatient Medications Prior to Visit  ?Medication Sig  ? aspirin 81 MG tablet Take 81 mg by mouth daily.  ? B Complex-C-Folic Acid (HM SUPER VITAMIN B COMPLEX/C PO) Take by mouth.  ? Chromium-Cinnamon 100-500 MCG-MG CAPS Take by mouth.  ? Coenzyme Q10 (CO Q-10) 200 MG CAPS Take by mouth.  ? ezetimibe (ZETIA) 10 MG tablet TAKE 1 TABLET BY MOUTH  DAILY  ? fluticasone (FLONASE) 50 MCG/ACT nasal spray Place 2 sprays into both nostrils daily.  ? Garlic 123XX123 MG CAPS Take by mouth.  ? glucose blood (CONTOUR  NEXT TEST) test strip USE TO TEST BLOOD SUGAR  TWICE DAILY AS NEEDED  ? losartan (COZAAR) 100 MG tablet TAKE 1 TABLET BY MOUTH  DAILY  ? metFORMIN (GLUCOPHAGE) 1000 MG tablet TAKE 1 TABLET BY MOUTH  TWICE DAILY WITH MEALS  ? Microlet Lancets MISC Use as directed.  ? Misc Natural Products (DAILY HERBS PROSTATE PO) Take by mouth.  ? Omega-3 Fatty Acids (ULTRA OMEGA 3) 952 MG CAPS Take 1,400 mg by mouth.  ? Potassium Gluconate 550 MG TABS Take 99 mg by  mouth in the morning and at bedtime.  ? Red Yeast Rice 600 MG TABS Take by mouth.  ? sildenafil (VIAGRA) 50 MG tablet Take 1 tablet (50 mg total) by mouth as needed for erectile dysfunction.  ? vitamin E 1000 UNIT capsule Take 1,000 Units by mouth daily.  ? Zinc 50 MG CAPS Take by mouth.  ? [DISCONTINUED] Multiple Vitamins-Minerals (ZINC PO) Take by mouth.  ? ?No facility-administered medications prior to visit.  ? ? ?Review of Systems  ?Constitutional:  Negative for appetite change, chills and fever.  ?HENT:  Positive for rhinorrhea, sinus pressure, sinus pain and tinnitus. Negative for ear pain.   ?Eyes:  Negative for visual disturbance.  ?Respiratory:  Negative for cough, chest tightness, shortness of breath and wheezing.   ?Cardiovascular:  Negative for chest pain and palpitations.  ?Gastrointestinal:  Negative for abdominal pain, nausea and vomiting.  ?Neurological:  Positive for headaches.  ? ?Last hemoglobin A1c ?Lab Results  ?Component Value Date  ? HGBA1C 7.8 (A) 08/11/2021  ? ?  ?  Objective  ?  ?BP 120/72 (BP Location: Left Arm, Cuff Size: Large)   Pulse 78   Temp 97.9 ?F (36.6 ?C) (Oral)   Resp 16   Ht 5\' 6"  (1.676 m)   Wt 174 lb 9.6 oz (79.2 kg)   SpO2 99%   BMI 28.18 kg/m?  ?BP Readings from Last 3 Encounters:  ?08/11/21 120/72  ?03/07/21 (!) 148/71  ?10/21/20 134/81  ? ?Wt Readings from Last 3 Encounters:  ?08/11/21 174 lb 9.6 oz (79.2 kg)  ?03/07/21 174 lb (78.9 kg)  ?10/21/20 176 lb (79.8 kg)  ? ?  ? ?Physical Exam ?Vitals reviewed.  ?Constitutional:   ?   Appearance: Normal appearance. He is well-developed and normal weight.  ?HENT:  ?   Head: Normocephalic and atraumatic.  ?   Right Ear: Tympanic membrane and external ear normal.  ?   Left Ear: Tympanic membrane and external ear normal.  ?   Nose: Nose normal.  ?   Mouth/Throat:  ?   Pharynx: Oropharynx is clear.  ?Eyes:  ?   Conjunctiva/sclera: Conjunctivae normal.  ?   Pupils: Pupils are equal, round, and reactive to light.   ?Cardiovascular:  ?   Rate and Rhythm: Normal rate and regular rhythm.  ?   Heart sounds: Normal heart sounds.  ?Pulmonary:  ?   Effort: Pulmonary effort is normal.  ?   Breath sounds: Normal breath sounds.  ?Abdominal:  ?   General: Bowel sounds are normal.  ?   Palpations: Abdomen is soft.  ?Musculoskeletal:  ?   Cervical back: Normal range of motion and neck supple.  ?Skin: ?   General: Skin is warm and dry.  ?Neurological:  ?   General: No focal deficit present.  ?   Mental Status: He is alert and oriented to person, place, and time.  ?   Cranial Nerves: No cranial nerve deficit.  ?  Motor: No weakness.  ?   Gait: Gait normal.  ?Psychiatric:     ?   Mood and Affect: Mood normal.     ?   Behavior: Behavior normal.     ?   Thought Content: Thought content normal.     ?   Judgment: Judgment normal.  ?  ? ? ?Results for orders placed or performed in visit on 08/11/21  ?POCT glycosylated hemoglobin (Hb A1C)  ?Result Value Ref Range  ? Hemoglobin A1C 7.8 (A) 4.0 - 5.6 %  ? Est. average glucose Bld gHb Est-mCnc 177   ? ? Assessment & Plan  ?  ? ?1. Type 2 diabetes mellitus without complication, without long-term current use of insulin ([place]) ?Once he is stable to 7.8.  Jason Glenn wish to add another medicine but patient has not been working on diet and exercise at all.  He wishes to make no changes.  Follow-up 3 to 4 months with another A1c.  Goal less than 7 ?- POCT glycosylated hemoglobin (Hb A1C) ? ?2. Essential hypertension ?Blood pressure control on losartan 100 ? ?3. Headache in front of head ?Patient is worried about this headache.  Peers to be a tension headache with no neurologic symptoms.  At this time refer to neurology ?- Ambulatory referral to Neurology ? ?4. Decreased hearing of both ears ?Presbycusis ? ?5. Pure hypercholesterolemia ?States he is completely intolerant of statins.  On Zetia ? ? ?No follow-ups on file.  ?   ? ?Jason Glenn, Wilhemena Durie, MD, have reviewed all documentation for this visit. The  documentation on 08/12/21 for the exam, diagnosis, procedures, and orders are all accurate and complete. ? ? ? ?Kyre Jeffries Cranford Mon, MD  ?[facility] ?[phone] (phone) ?[phone] (fax) ? ?[place]

## 2021-08-11 ENCOUNTER — Other Ambulatory Visit: Payer: Self-pay | Admitting: Family Medicine

## 2021-08-11 ENCOUNTER — Ambulatory Visit: Payer: BC Managed Care – PPO | Admitting: Family Medicine

## 2021-08-11 ENCOUNTER — Encounter: Payer: Self-pay | Admitting: Family Medicine

## 2021-08-11 VITALS — BP 120/72 | HR 78 | Temp 97.9°F | Resp 16 | Ht 66.0 in | Wt 174.6 lb

## 2021-08-11 DIAGNOSIS — I10 Essential (primary) hypertension: Secondary | ICD-10-CM | POA: Diagnosis not present

## 2021-08-11 DIAGNOSIS — E7849 Other hyperlipidemia: Secondary | ICD-10-CM

## 2021-08-11 DIAGNOSIS — E119 Type 2 diabetes mellitus without complications: Secondary | ICD-10-CM

## 2021-08-11 DIAGNOSIS — H9193 Unspecified hearing loss, bilateral: Secondary | ICD-10-CM | POA: Diagnosis not present

## 2021-08-11 DIAGNOSIS — R519 Headache, unspecified: Secondary | ICD-10-CM

## 2021-08-11 DIAGNOSIS — E7800 Pure hypercholesterolemia, unspecified: Secondary | ICD-10-CM

## 2021-08-11 LAB — POCT GLYCOSYLATED HEMOGLOBIN (HGB A1C)
Est. average glucose Bld gHb Est-mCnc: 177
Hemoglobin A1C: 7.8 % — AB (ref 4.0–5.6)

## 2021-08-11 LAB — HM DIABETES EYE EXAM

## 2021-09-14 ENCOUNTER — Ambulatory Visit: Payer: BC Managed Care – PPO | Admitting: Neurology

## 2021-09-14 ENCOUNTER — Encounter: Payer: Self-pay | Admitting: Neurology

## 2021-09-14 VITALS — BP 180/81 | HR 61 | Ht 65.0 in | Wt 172.5 lb

## 2021-09-14 DIAGNOSIS — R519 Headache, unspecified: Secondary | ICD-10-CM | POA: Diagnosis not present

## 2021-09-14 DIAGNOSIS — R51 Headache with orthostatic component, not elsewhere classified: Secondary | ICD-10-CM

## 2021-09-14 MED ORDER — ALPRAZOLAM 0.25 MG PO TABS: ORAL_TABLET | ORAL | 0 refills | Status: AC

## 2021-09-14 NOTE — Patient Instructions (Addendum)
MRI brain w/wo contrast ?Sleep evaluation ?Referral for sleep apnea, he lives in [place], call dr Sullivan Lone and ask for a referral in the meantime ? ?Sleep Study, Adult ?A sleep study (polysomnogram) is a series of tests done while you are sleeping. A sleep study records your brain waves, heart rate, breathing rate, oxygen level, and eye and leg movements. ?A sleep study helps your health care provider: ?See how well you sleep. ?Diagnose a sleep disorder. ?Determine how severe your sleep disorder is. ?Create a plan to treat your sleep disorder. ?Your health care provider may recommend a sleep study if you: ?Feel sleepy on most days. ?Snore loudly while sleeping. ?Have unusual behaviors while you sleep, such as walking. ?Have brief periods in which you stop breathing during sleep (sleepapnea). ?Fall asleep suddenly during the day (narcolepsy). ?Have trouble falling asleep or staying asleep (insomnia). ?Feel like you need to move your legs when trying to fall asleep (restless legs syndrome). ?Move your legs by flexing and extending them regularly while asleep (periodic limb movement disorder). ?Act out your dreams while you sleep (sleep behavior disorder). ?Feel like you cannot move when you first wake up (sleep paralysis). ?What tests are part of a sleep study? ?Most sleep studies record the following during sleep: ?Brain activity. ?Eye movements. ?Heart rate and rhythm. ?Breathing rate and rhythm. ?Blood-oxygen level. ?Blood pressure. ?Chest and belly movement as you breathe. ?Arm and leg movements. ?Snoring or other noises. ?Body position. ?Where are sleep studies done? ?Sleep studies are done at sleep centers. A sleep center may be inside a hospital, office, or clinic. ?The room where you have the study may look like a hospital room or a hotel room. The health care providers doing the study may come in and out of the room during the study. Most of the time, they will be in another room monitoring your test as you  sleep. ?How are sleep studies done? ?Most sleep studies are done during a normal period of time for a full night of sleep. You will arrive at the study center in the evening and go home in the morning. ?Before the test ?Bring your pajamas and toothbrush with you to the sleep study. ?Do not have caffeine on the day of your sleep study. ?Do not drink alcohol on the day of your sleep study. ?Your health care provider will let you know if you should stop taking any of your regular medicines before the test. ?During the test ? ?  ? ?Round, sticky patches with sensors attached to recording wires (electrodes) are placed on your scalp, face, chest, and limbs. ?Wires from all the electrodes and sensors run from your bed to a computer. The wires can be taken off and put back on if you need to get out of bed to go to the bathroom. ?A sensor is placed over your nose to measure airflow. ?A finger clip is put on your finger or ear to measure your blood oxygen level (pulse oximetry). ?A belt is placed around your belly and a belt is placed around your chest to measure breathing movements. ?If you have signs of the sleep disorder called sleep apnea during your test, you may get a treatment mask to wear for the second half of the night. ?The mask provides positive airway pressure (PAP) to help you breathe better during sleep. This may greatly improve your sleep apnea. ?You will then have all tests done again with the mask in place to see if your measurements and recordings  change. ?After the test ?A medical doctor who specializes in sleep will evaluate the results of your sleep study and share them with you and your primary health care provider. ?Based on your results, your medical history, and a physical exam, you may be diagnosed with a sleep disorder, such as: ?Sleep apnea. ?Restless legs syndrome. ?Sleep-related behavior disorder. ?Sleep-related movement disorders. ?Sleep-related seizure disorders. ?Your health care team will  help determine your treatment options based on your diagnosis. This may include: ?Improving your sleep habits (sleep hygiene). ?Wearing a continuous positive airway pressure (CPAP) or bi-level positive airway pressure (BIPAP) mask. ?Wearing an oral device at night to improve breathing and reduce snoring. ?Taking medicines. ?Follow these instructions at home: ?Take over-the-counter and prescription medicines only as told by your health care provider. ?If you are instructed to use a CPAP or BIPAP mask, make sure you use it nightly as directed. ?Make any lifestyle changes that your health care provider recommends. ?If you were given a device to open your airway while you sleep, use it only as told by your health care provider. ?Do not use any tobacco products, such as cigarettes, chewing tobacco, and e-cigarettes. If you need help quitting, ask your health care provider. ?Keep all follow-up visits as told by your health care provider. This is important. ?Summary ?A sleep study (polysomnogram) is a series of tests done while you are sleeping. It shows how well you sleep. ?Most sleep studies are done over one full night of sleep. You will arrive at the study center in the evening and go home in the morning. ?If you have signs of the sleep disorder called sleep apnea during your test, you may get a treatment mask to wear for the second half of the night. ?A medical doctor who specializes in sleep will evaluate the results of your sleep study and share them with your primary health care provider. ?This information is not intended to replace advice given to you by your health care provider. Make sure you discuss any questions you have with your health care provider. ?Document Revised: 03/15/2021 Document Reviewed: 02/28/2021 ?Elsevier Patient Education ? 2023 Elsevier Inc. ? ?

## 2021-09-14 NOTE — Progress Notes (Signed)
?[facility] ? ? ? ?Provider:  Dr Jaynee Eagles ?Requesting Provider: Jerrol Banana.,* ?Primary Care Provider:  Jerrol Banana., MD ? ?CC:  headaches ? ?HPI:  Jason Glenn is a 61 y.o. male here as requested by Jerrol Banana.,* for headaches. Patient does not know why he is here, he says for anxiety. We do not treat anxiety. He states he saw ENT, they recommended neurology and he declined, he is here to day with wife. Problems in the temples and radiates a little, bilaterally, symmetrically, he wakes with headaches, he snores, especially when on his back, he stops breathing per his wife, wife provides much information and she has sleep apnea and says she thinks he has it, he is fatigued a lot and can fall asleep immediately. No vision changes, not exertional but worse positional, no weakness, no focal neurologic deficits. Headaches not as bad as before, 3-4 weeks ago he was having them every morning. Sinus medicine would help. Worse in the morning supine. No other focal neurologic deficits, associated symptoms, inciting events or modifiable factors. ? ?Reviewed Dr. Alben Spittle notes: Patient C/O headache on and off x several months. Patient reports headache can last all day. And he reports headaches several days out of the week. Patient reports pain is like pressure all across his forehead and feeling off balance. He denies any chest pain, shortness of breath, visual changes, nausea or vomiting. Patient reports he saw ENT about three months ago and they suggested to go to neurology, which he refused at that time. Patient reports he does not take any OTC medications for pain. ? ?Review of Systems: ?Patient complains of symptoms per HPI as well as the following symptoms anxiety. Pertinent negatives and positives per HPI. All others negative. ? ? ?Social History  ? ?Socioeconomic History  ? Marital status: Married  ?  Spouse name: Not on file  ? Number of children: Not on file  ?  Years of education: Not on file  ? Highest education level: Not on file  ?Occupational History  ? Not on file  ?Tobacco Use  ? Smoking status: Never  ? Smokeless tobacco: Never  ?Vaping Use  ? Vaping Use: Never used  ?Substance and Sexual Activity  ? Alcohol use: No  ? Drug use: No  ? Sexual activity: Yes  ?Other Topics Concern  ? Not on file  ?Social History Narrative  ? Caffeine coffee 1 cup daily. Soda- rare.  Education 8th grade, working at [employer]. ([employer]).    ? ?Social Determinants of Health  ? ?Financial Resource Strain: Not on file  ?Food Insecurity: Not on file  ?Transportation Needs: Not on file  ?Physical Activity: Not on file  ?Stress: Not on file  ?Social Connections: Not on file  ?Intimate Partner Violence: Not on file  ? ? ?Family History  ?Problem Relation Age of Onset  ? Stroke Mother   ? Seizures Mother   ? Cancer Mother   ?     type of salivary vs carotid gland cancer threated wuth chemotherapy  ? CAD Father   ? Leukemia Father   ? Migraines Sister   ? ? ?Past Medical History:  ?Diagnosis Date  ? Anxiety   ? Diabetes mellitus without complication ([place])   ? Hyperlipemia   ? Hyperlipidemia   ? Hypertension   ? ? ?Patient Active Problem List  ? Diagnosis Date Noted  ? Anxiety 10/08/2014  ? 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  HLD (hyperlipidemia) 10/08/2014  ? Diabetes mellitus, type 2 ([place]) 10/08/2014  ? ? ?Past Surgical History:  ?Procedure Laterality Date  ? COLONOSCOPY    ? COLONOSCOPY WITH PROPOFOL N/A 07/23/2020  ? Procedure: COLONOSCOPY WITH PROPOFOL;  Surgeon: Virgel Manifold, MD;  Location: [facility] ENDOSCOPY;  Service: Endoscopy;  Laterality: N/A;  ? POLYPECTOMY    ? tubular adenoma from colon  ? ? ?Current Outpatient Medications  ?Medication Sig Dispense Refill  ? ALPRAZolam (XANAX) 0.25 MG tablet Take 1-2 tabs (0.25mg -0.50mg ) 30-60 minutes before procedure. May repeat if needed.Do not drive. 4 tablet 0  ? aspirin 81 MG tablet Take 81 mg by mouth daily.    ?  B Complex-C-Folic Acid (HM SUPER VITAMIN B COMPLEX/C PO) Take by mouth.    ? Chromium-Cinnamon 100-500 MCG-MG CAPS Take by mouth.    ? Coenzyme Q10 (CO Q-10) 200 MG CAPS Take by mouth.    ? ezetimibe (ZETIA) 10 MG tablet TAKE 1 TABLET BY MOUTH  DAILY 90 tablet 3  ? fluticasone (FLONASE) 50 MCG/ACT nasal spray Place 2 sprays into both nostrils daily. 16 g 6  ? Garlic 123XX123 MG CAPS Take by mouth.    ? glucose blood (CONTOUR NEXT TEST) test strip USE TO TEST BLOOD SUGAR  TWICE DAILY AS NEEDED 200 strip 2  ? losartan (COZAAR) 100 MG tablet TAKE 1 TABLET BY MOUTH  DAILY 90 tablet 1  ? metFORMIN (GLUCOPHAGE) 1000 MG tablet TAKE 1 TABLET BY MOUTH  TWICE DAILY WITH MEALS 180 tablet 3  ? Microlet Lancets MISC Use as directed. 100 each 12  ? Misc Natural Products (DAILY HERBS PROSTATE PO) Take by mouth.    ? Omega-3 Fatty Acids (ULTRA OMEGA 3) 952 MG CAPS Take 1,400 mg by mouth.    ? Potassium Gluconate 550 MG TABS Take 99 mg by mouth in the morning and at bedtime.    ? Red Yeast Rice 600 MG TABS Take by mouth.    ? sildenafil (VIAGRA) 50 MG tablet Take 1 tablet (50 mg total) by mouth as needed for erectile dysfunction. 6 tablet 5  ? vitamin E 1000 UNIT capsule Take 1,000 Units by mouth daily.    ? Zinc 50 MG CAPS Take by mouth.    ? ?No current facility-administered medications for this visit.  ? ? ?Allergies as of 09/14/2021 - Review Complete 09/14/2021  ?Allergen Reaction Noted  ? Pravastatin sodium  10/08/2014  ? ? ?Vitals: ?BP (!) 180/81   Pulse 61   Ht 5\' 5"  (1.651 m)   Wt 172 lb 8 oz (78.2 kg)   BMI 28.71 kg/m?  ?Last Weight:  ?Wt Readings from Last 1 Encounters:  ?09/14/21 172 lb 8 oz (78.2 kg)  ? ?Last Height:   ?Ht Readings from Last 1 Encounters:  ?09/14/21 5\' 5"  (1.651 m)  ? ? ? ?Physical exam: ?Exam: ?Gen: NAD, conversant, well nourised, well groomed                     ?CV: RRR, no MRG. No Carotid Bruits. No peripheral edema, warm, nontender ?Eyes: Conjunctivae clear without exudates or  hemorrhage ? ?Neuro: ?Detailed Neurologic Exam ? ?Speech: ?   Speech is normal; fluent and spontaneous with normal comprehension.  ?Cognition: ?   The patient is oriented to person, place, and time;  ?   recent and remote memory intact;  ?   language fluent;  ?   normal attention, concentration,  ?   fund of  knowledge ?Cranial Nerves: ?   The pupils are equal, round, and reactive to light.  Pupils too small to visualize fundi, attempted.  Visual fields are full to finger confrontation. Extraocular movements are intact. Trigeminal sensation is intact and the muscles of mastication are normal. The face is symmetric. The palate elevates in the midline. Hearing intact. Voice is normal. Shoulder shrug is normal. The tongue has normal motion without fasciculations.  ? ?Coordination: ?   Normal ? ?Gait: ?   normal.  ? ?Motor Observation: ?   No asymmetry, no atrophy, and no involuntary movements noted. ?Tone: ?   Normal muscle tone.   ? ?Posture: ?   Posture is normal. normal erect ?   ?Strength: ?   Strength is V/V in the upper and lower limbs.  ?    ?Sensation: intact to LT ?    ?Reflex Exam: ? ?DTR's: ?   Deep tendon reflexes in the upper and lower extremities are symmetrical bilaterally.   ?Toes: ?   The toes are equiv bilaterally.   ?Clonus: ?   Clonus is absent. ?  ? ?Assessment/Plan:  Patient here with headaches. He reports:he wakes with headaches, he snores, especially when on his back, he stops breathing per his wife, wife provides much information and she has sleep apnea and says she thinks he has it, he is fatigued a lot and can fall asleep immediately. ? ?-I suggest a sleep study, he lives in [place] and would like to go there, I do not know any sleep doctors in [place] so I have messaged his primary care Dr. Rosanna Randy for assistance ?-Considering headaches are worse in the morning, could be positional, new onset after the age of 76, will order an MRI of the brain. MRI brain due to concerning symptoms of  morning headaches, positional to look for space occupying mass, chiari or intracranial hypertension (pseudotumor), strokes, malignancies, vasculidities, demyelination(multiple sclerosis) or other ? ?Orders Placed This Encounter  ?[facility]

## 2021-09-21 ENCOUNTER — Ambulatory Visit: Payer: Self-pay | Admitting: Neurology

## 2021-09-21 ENCOUNTER — Telehealth: Payer: Self-pay | Admitting: Neurology

## 2021-09-21 NOTE — Telephone Encounter (Signed)
[employer] no auth req spoke to Robin Searing ref # [id number] order sent to centralized scheduling to be scheduled at [place]  ?

## 2021-10-03 ENCOUNTER — Ambulatory Visit
Admission: RE | Admit: 2021-10-03 | Discharge: 2021-10-03 | Disposition: A | Discharge status: Home / Self Care | Payer: BC Managed Care – PPO | Source: Ambulatory Visit | Attending: Neurology | Admitting: Neurology

## 2021-10-03 DIAGNOSIS — R51 Headache with orthostatic component, not elsewhere classified: Secondary | ICD-10-CM | POA: Diagnosis not present

## 2021-10-03 DIAGNOSIS — R519 Headache, unspecified: Secondary | ICD-10-CM | POA: Diagnosis not present

## 2021-10-03 DIAGNOSIS — R413 Other amnesia: Secondary | ICD-10-CM | POA: Diagnosis not present

## 2021-10-03 MED ORDER — GADOBUTROL 1 MMOL/ML IV SOLN
8.0000 mL | Freq: Once | INTRAVENOUS | Status: AC | PRN
  Administered 2021-10-03: 8 mL via INTRAVENOUS

## 2021-11-02 ENCOUNTER — Other Ambulatory Visit: Payer: Self-pay | Admitting: Family Medicine

## 2021-11-02 DIAGNOSIS — I10 Essential (primary) hypertension: Secondary | ICD-10-CM

## 2021-12-19 ENCOUNTER — Ambulatory Visit: Payer: BC Managed Care – PPO | Admitting: Family Medicine

## 2021-12-19 NOTE — Progress Notes (Unsigned)
Established patient visit  I,Jason Glenn,acting as a scribe for Jason Mans, MD.,have documented all relevant documentation on the behalf of Jason Mans, MD,as directed by  Jason Mans, MD while in the presence of Jason Mans, MD.   Patient: Jason Glenn [facility]   DOB: 1961/02/15   61 y.o. Male  MRN: [id number] Visit Date: 12/21/2021  Today's healthcare provider: [name], MD   Chief Complaint  Patient presents with   Follow-up   Diabetes   Hypertension   Subjective    HPI  Patient comes in today for follow-up.  He is feeling well.  He has no complaints.  He continues to work full-time. He does inquire about the Shingrix vaccine.  Diabetes Mellitus Type II, follow-up  Lab Results  Component Value Date   HGBA1C 7.8 (A) 08/11/2021   HGBA1C 7.8 (H) 03/11/2021   HGBA1C 7.5 (A) 10/21/2020   Last seen for diabetes 5 months ago.  Management since then includes; Once he is stable to 7.8. I wish to add another medicine but patient has not been working on diet and exercise at all.  He wishes to make no changes.  Follow-up 3 to 4 months with another A1c.  Goal less than 7  Home blood sugar records: fasting range: 160-170 Most Recent Eye Exam: 08/11/2021  --------------------------------------------------------------------------------------------------- Hypertension, follow-up  BP Readings from Last 3 Encounters:  12/21/21 (!) 156/84  09/14/21 (!) 180/81  08/11/21 120/72   Wt Readings from Last 3 Encounters:  12/21/21 175 lb (79.4 kg)  09/14/21 172 lb 8 oz (78.2 kg)  08/11/21 174 lb 9.6 oz (79.2 kg)     He was last seen for hypertension 5 months ago.  Management since that visit includes; Blood pressure controlled on losartan 100 mg.  Outside blood pressures are not being checked.  ---------------------------------------------------------------------------------------------------  Medications: Outpatient Medications Prior to Visit   Medication Sig   ALPRAZolam (XANAX) 0.25 MG tablet Take 1-2 tabs (0.25mg -0.50mg ) 30-60 minutes before procedure. May repeat if needed.Do not drive.   aspirin 81 MG tablet Take 81 mg by mouth daily.   B Complex-C-Folic Acid (HM SUPER VITAMIN B COMPLEX/C PO) Take by mouth.   Chromium-Cinnamon 100-500 MCG-MG CAPS Take by mouth.   Coenzyme Q10 (CO Q-10) 200 MG CAPS Take by mouth.   ezetimibe (ZETIA) 10 MG tablet TAKE 1 TABLET BY MOUTH  DAILY   fluticasone (FLONASE) 50 MCG/ACT nasal spray Place 2 sprays into both nostrils daily.   Garlic 1000 MG CAPS Take by mouth.   glucose blood (CONTOUR NEXT TEST) test strip USE TO TEST BLOOD SUGAR  TWICE DAILY AS NEEDED   losartan (COZAAR) 100 MG tablet TAKE 1 TABLET BY MOUTH DAILY   metFORMIN (GLUCOPHAGE) 1000 MG tablet TAKE 1 TABLET BY MOUTH  TWICE DAILY WITH MEALS   Microlet Lancets MISC Use as directed.   Misc Natural Products (DAILY HERBS PROSTATE PO) Take by mouth.   Omega-3 Fatty Acids (ULTRA OMEGA 3) 952 MG CAPS Take 1,400 mg by mouth.   Potassium Gluconate 550 MG TABS Take 99 mg by mouth in the morning and at bedtime.   Red Yeast Rice 600 MG TABS Take by mouth.   sildenafil (VIAGRA) 50 MG tablet Take 1 tablet (50 mg total) by mouth as needed for erectile dysfunction.   vitamin E 1000 UNIT capsule Take 1,000 Units by mouth daily.   Zinc 50 MG CAPS Take by mouth.   No facility-administered medications prior to  visit.    Review of Systems  Constitutional:  Negative for appetite change, chills and fever.  Respiratory:  Negative for chest tightness, shortness of breath and wheezing.   Cardiovascular:  Negative for chest pain and palpitations.  Gastrointestinal:  Negative for abdominal pain, nausea and vomiting.       Objective    BP (!) 156/84 (BP Location: Right Arm, Cuff Size: Large)   Pulse 68   Resp 16   Ht 5\' 6"  (1.676 m)   Wt 175 lb (79.4 kg)   SpO2 98%   BMI 28.25 kg/m    Physical Exam Vitals reviewed.  Constitutional:       General: He is not in acute distress.    Appearance: He is well-developed.  HENT:     Head: Normocephalic and atraumatic.     Right Ear: Hearing normal.     Left Ear: Hearing normal.     Nose: Nose normal.  Eyes:     General: Lids are normal. No scleral icterus.       Right eye: No discharge.        Left eye: No discharge.     Conjunctiva/sclera: Conjunctivae normal.  Cardiovascular:     Rate and Rhythm: Normal rate and regular rhythm.     Heart sounds: Normal heart sounds.  Pulmonary:     Effort: Pulmonary effort is normal. No respiratory distress.  Skin:    General: Skin is warm and dry.     Findings: No lesion or rash.  Neurological:     General: No focal deficit present.     Mental Status: He is alert and oriented to person, place, and time.     Comments: Normal diabetic foot exam.  Psychiatric:        Mood and Affect: Mood normal.        Speech: Speech normal.        Behavior: Behavior normal.        Thought Content: Thought content normal.        Judgment: Judgment normal.       No results found for any visits on 12/21/21.  Assessment & Plan     1. Type 2 diabetes mellitus without complication, without long-term current use of insulin (HCC) Normal diabetic foot exam today. Goal A1c less than 6.5-7 - Lipid panel - TSH - CBC w/Diff/Platelet - Comprehensive Metabolic Panel (CMET) - Hemoglobin A1c  2. Essential hypertension Check home blood pressure weekly and bring in on the next visit - Lipid panel - TSH - CBC w/Diff/Platelet - Comprehensive Metabolic Panel (CMET) - Hemoglobin A1c  3. Pure hypercholesterolemia Discuss low-dose rosuvastatin on his next visit for cholesterol - Lipid panel - TSH - CBC w/Diff/Platelet - Comprehensive Metabolic Panel (CMET) - Hemoglobin A1c   Return in about 4 months (around 04/22/2022).      I, 14/01/2022, MD, have reviewed all documentation for this visit. The documentation on 12/22/21 for the exam,  diagnosis, procedures, and orders are all accurate and complete.    Jason Glenn 02/21/22, MD  [facility] [phone] (phone) [phone] (fax)  [facility]

## 2021-12-21 ENCOUNTER — Ambulatory Visit: Payer: BC Managed Care – PPO | Admitting: Family Medicine

## 2021-12-21 ENCOUNTER — Encounter: Payer: Self-pay | Admitting: Family Medicine

## 2021-12-21 VITALS — BP 156/84 | HR 68 | Resp 16 | Ht 66.0 in | Wt 175.0 lb

## 2021-12-21 DIAGNOSIS — E119 Type 2 diabetes mellitus without complications: Secondary | ICD-10-CM | POA: Diagnosis not present

## 2021-12-21 DIAGNOSIS — E7800 Pure hypercholesterolemia, unspecified: Secondary | ICD-10-CM | POA: Diagnosis not present

## 2021-12-21 DIAGNOSIS — I10 Essential (primary) hypertension: Secondary | ICD-10-CM

## 2021-12-21 NOTE — Patient Instructions (Signed)
CHECK HOME BLOOD PRESSURES WEEKLY. 

## 2021-12-22 LAB — HEMOGLOBIN A1C
Est. average glucose Bld gHb Est-mCnc: 174 mg/dL
Hgb A1c MFr Bld: 7.7 % — ABNORMAL HIGH (ref 4.8–5.6)

## 2021-12-22 LAB — CBC WITH DIFFERENTIAL/PLATELET
Basophils Absolute: 0.1 x10E3/uL (ref 0.0–0.2)
Basos: 1 %
EOS (ABSOLUTE): 0.4 x10E3/uL (ref 0.0–0.4)
Eos: 6 %
Hematocrit: 38.1 % (ref 37.5–51.0)
Hemoglobin: 13 g/dL (ref 13.0–17.7)
Immature Grans (Abs): 0 x10E3/uL (ref 0.0–0.1)
Immature Granulocytes: 0 %
Lymphocytes Absolute: 1.8 x10E3/uL (ref 0.7–3.1)
Lymphs: 30 %
MCH: 30.4 pg (ref 26.6–33.0)
MCHC: 34.1 g/dL (ref 31.5–35.7)
MCV: 89 fL (ref 79–97)
Monocytes Absolute: 0.6 x10E3/uL (ref 0.1–0.9)
Monocytes: 10 %
Neutrophils Absolute: 3.3 x10E3/uL (ref 1.4–7.0)
Neutrophils: 53 %
Platelets: 253 x10E3/uL (ref 150–450)
RBC: 4.27 x10E6/uL (ref 4.14–5.80)
RDW: 13 % (ref 11.6–15.4)
WBC: 6.1 x10E3/uL (ref 3.4–10.8)

## 2021-12-22 LAB — LIPID PANEL
Chol/HDL Ratio: 4.7 ratio (ref 0.0–5.0)
Cholesterol, Total: 216 mg/dL — ABNORMAL HIGH (ref 100–199)
HDL: 46 mg/dL
LDL Chol Calc (NIH): 125 mg/dL — ABNORMAL HIGH (ref 0–99)
Triglycerides: 257 mg/dL — ABNORMAL HIGH (ref 0–149)
VLDL Cholesterol Cal: 45 mg/dL — ABNORMAL HIGH (ref 5–40)

## 2021-12-22 LAB — COMPREHENSIVE METABOLIC PANEL
ALT: 37 IU/L (ref 0–44)
AST: 22 IU/L (ref 0–40)
Albumin/Globulin Ratio: 2 (ref 1.2–2.2)
Albumin: 4.8 g/dL (ref 3.9–4.9)
Alkaline Phosphatase: 74 IU/L (ref 44–121)
BUN/Creatinine Ratio: 19 (ref 10–24)
BUN: 17 mg/dL (ref 8–27)
Bilirubin Total: 0.3 mg/dL (ref 0.0–1.2)
CO2: 24 mmol/L (ref 20–29)
Calcium: 10.1 mg/dL (ref 8.6–10.2)
Chloride: 100 mmol/L (ref 96–106)
Creatinine, Ser: 0.9 mg/dL (ref 0.76–1.27)
Globulin, Total: 2.4 g/dL (ref 1.5–4.5)
Glucose: 117 mg/dL — ABNORMAL HIGH (ref 70–99)
Potassium: 4.9 mmol/L (ref 3.5–5.2)
Sodium: 140 mmol/L (ref 134–144)
Total Protein: 7.2 g/dL (ref 6.0–8.5)
eGFR: 96 mL/min/{1.73_m2} (ref >59)

## 2021-12-22 LAB — TSH: TSH: 3.7 u[IU]/mL (ref 0.450–4.500)

## 2022-01-03 ENCOUNTER — Telehealth: Payer: Self-pay

## 2022-01-03 NOTE — Telephone Encounter (Signed)
Pt returned our call for lab results. Shared provider's note.  Pt states he is already taking Zetia 10mg .  Please advise.  Richard ., MD  12/28/2021  1:00 PM EDT     Labs stable.  Consider trying Zetia as a nonstatin treatment option for his cholesterol please advise patient.

## 2022-03-22 ENCOUNTER — Other Ambulatory Visit: Payer: Self-pay | Admitting: Family Medicine

## 2022-03-22 DIAGNOSIS — E119 Type 2 diabetes mellitus without complications: Secondary | ICD-10-CM

## 2022-03-22 MED ORDER — METFORMIN HCL 1000 MG PO TABS: 1000.0000 mg | ORAL_TABLET | Freq: Two times a day (BID) | ORAL | 3 refills | Status: AC

## 2022-03-22 NOTE — Telephone Encounter (Signed)
Requested medication (s) are due for refill today:no  Requested medication (s) are on the active medication list: yes  Last refill:  08/11/21  Future visit scheduled: yes  Notes to clinic:  Unable to refill per protocol, Rx request is too soon. Last refill 08/11/21 for 90 and 3 RF.E-Prescribing Status: Receipt confirmed by pharmacy (08/11/2021  9:05 AM EDT). Should have enough RF until NP OV. Unable to refuse.     Requested Prescriptions  Pending Prescriptions Disp Refills   metFORMIN (GLUCOPHAGE) 1000 MG tablet 180 tablet 3    Sig: Take 1 tablet (1,000 mg total) by mouth 2 (two) times daily with a meal.     Endocrinology:  Diabetes - Biguanides Failed - 03/22/2022  3:26 PM      Failed - B12 Level in normal range and within 720 days    No results found for: "VITAMINB12"       Passed - Cr in normal range and within 360 days    Creatinine, Ser  Date Value Ref Range Status  12/21/2021 0.90 0.76 - 1.27 mg/dL Final   Creatinine, POC  Date Value Ref Range Status  02/14/2018 NA mg/dL Final         Passed - HBA1C is between 0 and 7.9 and within 180 days    Hgb A1c MFr Bld  Date Value Ref Range Status  12/21/2021 7.7 (H) 4.8 - 5.6 % Final    Comment:             Prediabetes: 5.7 - 6.4          Diabetes: >6.4          Glycemic control for adults with diabetes: <7.0          Passed - eGFR in normal range and within 360 days    GFR calc Af Amer  Date Value Ref Range Status  06/28/2020 90 >59 mL/min/1.73 Final    Comment:    **In accordance with recommendations from the NKF-ASN Task force,**   [facility] is in the process of updating its eGFR calculation to the   2021 CKD-EPI creatinine equation that estimates kidney function   without a race variable.    GFR calc non Af Amer  Date Value Ref Range Status  06/28/2020 77 >59 mL/min/1.73 Final   eGFR  Date Value Ref Range Status  12/21/2021 96 >59 mL/min/1.73 Final         Passed - Valid encounter within last 6 months     Recent Outpatient Visits           3 months ago Type 2 diabetes mellitus without complication, without long-term current use of insulin [facility])   [facility] Jerrol Banana., MD   7 months ago Type 2 diabetes mellitus without complication, without long-term current use of insulin [facility])   [facility] Jerrol Banana., MD   1 year ago Annual physical exam   [facility] Jerrol Banana., MD   1 year ago Type 2 diabetes mellitus without complication, without long-term current use of insulin [facility])   [facility] Jerrol Banana., MD   1 year ago Type 2 diabetes mellitus without complication, without long-term current use of insulin [facility])   [facility] Jerrol Banana., MD              Passed - CBC within normal limits and completed in the last 12 months    WBC  Date Value Ref Range Status  12/21/2021 6.1 3.4 - 10.8 x10E3/uL Final  05/27/2019 8.4 4.0 - 10.5 K/uL Final   RBC  Date Value Ref Range Status  12/21/2021 4.27 4.14 - 5.80 x10E6/uL Final  05/27/2019 4.48 4.22 - 5.81 MIL/uL Final   Hemoglobin  Date Value Ref Range Status  12/21/2021 13.0 13.0 - 17.7 g/dL Final   Hematocrit  Date Value Ref Range Status  12/21/2021 38.1 37.5 - 51.0 % Final   MCHC  Date Value Ref Range Status  12/21/2021 34.1 31.5 - 35.7 g/dL Final  05/27/2019 34.4 30.0 - 36.0 g/dL Final   [facility]  Date Value Ref Range Status  12/21/2021 30.4 26.6 - 33.0 pg Final  05/27/2019 30.1 26.0 - 34.0 pg Final   MCV  Date Value Ref Range Status  12/21/2021 89 79 - 97 fL Final   No results found for: "PLTCOUNTKUC", "LABPLAT", "POCPLA" RDW  Date Value Ref Range Status  12/21/2021 13.0 11.6 - 15.4 % Final

## 2022-03-22 NOTE — Telephone Encounter (Addendum)
Medication Refill - Medication: metFORMIN (GLUCOPHAGE) 1000 MG tablet. Patient has a NPA on 04/04/2022 and would like a short supply to hold him over to his appointment. Please call patient when medication has been sent to pharmacy.   Has the patient contacted their pharmacy? Yes.    (Agent: If yes, when and what did the pharmacy advise?) Contact PCP office   Preferred Pharmacy (with phone number or street name):  [facility] [address] [place], [place] - [address] Phone: [phone]  Fax: [phone]       Has the patient been seen for an appointment in the last year OR does the patient have an upcoming appointment? Yes.    Agent: Please be advised that RX refills may take up to 3 business days. We ask that you follow-up with your pharmacy.

## 2022-03-31 ENCOUNTER — Other Ambulatory Visit: Payer: Self-pay | Admitting: Family Medicine

## 2022-03-31 DIAGNOSIS — E119 Type 2 diabetes mellitus without complications: Secondary | ICD-10-CM

## 2022-03-31 MED ORDER — CONTOUR NEXT TEST VI STRP: ORAL_STRIP | 3 refills | Status: DC

## 2022-03-31 MED ORDER — MICROLET LANCETS MISC: 12 refills | Status: AC

## 2022-04-04 DIAGNOSIS — F419 Anxiety disorder, unspecified: Secondary | ICD-10-CM | POA: Diagnosis not present

## 2022-04-04 DIAGNOSIS — Z1331 Encounter for screening for depression: Secondary | ICD-10-CM | POA: Diagnosis not present

## 2022-04-04 DIAGNOSIS — Z0000 Encounter for general adult medical examination without abnormal findings: Secondary | ICD-10-CM | POA: Diagnosis not present

## 2022-04-04 DIAGNOSIS — E1159 Type 2 diabetes mellitus with other circulatory complications: Secondary | ICD-10-CM | POA: Diagnosis not present

## 2022-04-04 DIAGNOSIS — E7800 Pure hypercholesterolemia, unspecified: Secondary | ICD-10-CM | POA: Diagnosis not present

## 2022-04-10 DIAGNOSIS — D2261 Melanocytic nevi of right upper limb, including shoulder: Secondary | ICD-10-CM | POA: Diagnosis not present

## 2022-04-10 DIAGNOSIS — Z1283 Encounter for screening for malignant neoplasm of skin: Secondary | ICD-10-CM | POA: Diagnosis not present

## 2022-04-10 DIAGNOSIS — L570 Actinic keratosis: Secondary | ICD-10-CM | POA: Diagnosis not present

## 2022-04-10 DIAGNOSIS — Z08 Encounter for follow-up examination after completed treatment for malignant neoplasm: Secondary | ICD-10-CM | POA: Diagnosis not present

## 2022-04-10 DIAGNOSIS — D2272 Melanocytic nevi of left lower limb, including hip: Secondary | ICD-10-CM | POA: Diagnosis not present

## 2022-04-12 ENCOUNTER — Other Ambulatory Visit: Payer: Self-pay

## 2022-04-12 DIAGNOSIS — E119 Type 2 diabetes mellitus without complications: Secondary | ICD-10-CM

## 2022-04-12 MED ORDER — CONTOUR NEXT TEST VI STRP: ORAL_STRIP | 3 refills | Status: AC

## 2022-04-26 ENCOUNTER — Ambulatory Visit: Payer: BC Managed Care – PPO | Admitting: Family Medicine

## 2022-05-04 DIAGNOSIS — E1159 Type 2 diabetes mellitus with other circulatory complications: Secondary | ICD-10-CM | POA: Diagnosis not present

## 2022-05-04 DIAGNOSIS — I152 Hypertension secondary to endocrine disorders: Secondary | ICD-10-CM | POA: Diagnosis not present

## 2022-05-30 DIAGNOSIS — M720 Palmar fascial fibromatosis [Dupuytren]: Secondary | ICD-10-CM | POA: Diagnosis not present

## 2022-05-30 DIAGNOSIS — F419 Anxiety disorder, unspecified: Secondary | ICD-10-CM | POA: Diagnosis not present

## 2022-05-30 DIAGNOSIS — E1159 Type 2 diabetes mellitus with other circulatory complications: Secondary | ICD-10-CM | POA: Diagnosis not present

## 2022-05-30 DIAGNOSIS — R6882 Decreased libido: Secondary | ICD-10-CM | POA: Diagnosis not present

## 2022-06-09 DIAGNOSIS — G4733 Obstructive sleep apnea (adult) (pediatric): Secondary | ICD-10-CM | POA: Diagnosis not present

## 2022-07-05 DIAGNOSIS — R682 Dry mouth, unspecified: Secondary | ICD-10-CM | POA: Diagnosis not present

## 2022-07-05 DIAGNOSIS — Z79899 Other long term (current) drug therapy: Secondary | ICD-10-CM | POA: Diagnosis not present

## 2022-07-05 DIAGNOSIS — G4733 Obstructive sleep apnea (adult) (pediatric): Secondary | ICD-10-CM | POA: Diagnosis not present

## 2022-07-05 DIAGNOSIS — E1159 Type 2 diabetes mellitus with other circulatory complications: Secondary | ICD-10-CM | POA: Diagnosis not present

## 2022-07-05 DIAGNOSIS — I152 Hypertension secondary to endocrine disorders: Secondary | ICD-10-CM | POA: Diagnosis not present

## 2022-07-07 DIAGNOSIS — G4733 Obstructive sleep apnea (adult) (pediatric): Secondary | ICD-10-CM | POA: Diagnosis not present

## 2022-08-01 ENCOUNTER — Other Ambulatory Visit: Payer: Self-pay

## 2022-08-01 ENCOUNTER — Telehealth: Payer: Self-pay | Admitting: Family Medicine

## 2022-08-01 DIAGNOSIS — E119 Type 2 diabetes mellitus without complications: Secondary | ICD-10-CM

## 2022-08-01 NOTE — Telephone Encounter (Signed)
[facility] pharmacy faxed refill request for the following medications:   metFORMIN (GLUCOPHAGE) 1000 MG tablet   Please advise

## 2022-08-03 DIAGNOSIS — G4733 Obstructive sleep apnea (adult) (pediatric): Secondary | ICD-10-CM | POA: Diagnosis not present

## 2022-08-05 DIAGNOSIS — G4733 Obstructive sleep apnea (adult) (pediatric): Secondary | ICD-10-CM | POA: Diagnosis not present

## 2022-09-03 DIAGNOSIS — G4733 Obstructive sleep apnea (adult) (pediatric): Secondary | ICD-10-CM | POA: Diagnosis not present

## 2022-09-05 DIAGNOSIS — G4733 Obstructive sleep apnea (adult) (pediatric): Secondary | ICD-10-CM | POA: Diagnosis not present

## 2022-09-12 DIAGNOSIS — G4733 Obstructive sleep apnea (adult) (pediatric): Secondary | ICD-10-CM | POA: Diagnosis not present

## 2022-10-03 DIAGNOSIS — G4733 Obstructive sleep apnea (adult) (pediatric): Secondary | ICD-10-CM | POA: Diagnosis not present

## 2022-10-05 DIAGNOSIS — G4733 Obstructive sleep apnea (adult) (pediatric): Secondary | ICD-10-CM | POA: Diagnosis not present

## 2022-11-03 DIAGNOSIS — G4733 Obstructive sleep apnea (adult) (pediatric): Secondary | ICD-10-CM | POA: Diagnosis not present

## 2022-11-05 DIAGNOSIS — G4733 Obstructive sleep apnea (adult) (pediatric): Secondary | ICD-10-CM | POA: Diagnosis not present

## 2022-12-03 DIAGNOSIS — G4733 Obstructive sleep apnea (adult) (pediatric): Secondary | ICD-10-CM | POA: Diagnosis not present

## 2022-12-05 DIAGNOSIS — G4733 Obstructive sleep apnea (adult) (pediatric): Secondary | ICD-10-CM | POA: Diagnosis not present

## 2022-12-07 DIAGNOSIS — G4733 Obstructive sleep apnea (adult) (pediatric): Secondary | ICD-10-CM | POA: Diagnosis not present

## 2022-12-07 DIAGNOSIS — E1159 Type 2 diabetes mellitus with other circulatory complications: Secondary | ICD-10-CM | POA: Diagnosis not present

## 2022-12-07 DIAGNOSIS — F419 Anxiety disorder, unspecified: Secondary | ICD-10-CM | POA: Diagnosis not present

## 2022-12-07 DIAGNOSIS — E7800 Pure hypercholesterolemia, unspecified: Secondary | ICD-10-CM | POA: Diagnosis not present

## 2023-01-05 DIAGNOSIS — G4733 Obstructive sleep apnea (adult) (pediatric): Secondary | ICD-10-CM | POA: Diagnosis not present

## 2023-02-05 DIAGNOSIS — G4733 Obstructive sleep apnea (adult) (pediatric): Secondary | ICD-10-CM | POA: Diagnosis not present

## 2023-03-07 DIAGNOSIS — G4733 Obstructive sleep apnea (adult) (pediatric): Secondary | ICD-10-CM | POA: Diagnosis not present

## 2023-04-07 DIAGNOSIS — G4733 Obstructive sleep apnea (adult) (pediatric): Secondary | ICD-10-CM | POA: Diagnosis not present

## 2023-04-16 DIAGNOSIS — D225 Melanocytic nevi of trunk: Secondary | ICD-10-CM | POA: Diagnosis not present

## 2023-04-16 DIAGNOSIS — Z85828 Personal history of other malignant neoplasm of skin: Secondary | ICD-10-CM | POA: Diagnosis not present

## 2023-04-16 DIAGNOSIS — L570 Actinic keratosis: Secondary | ICD-10-CM | POA: Diagnosis not present

## 2023-04-16 DIAGNOSIS — D2262 Melanocytic nevi of left upper limb, including shoulder: Secondary | ICD-10-CM | POA: Diagnosis not present

## 2023-04-16 DIAGNOSIS — Z1283 Encounter for screening for malignant neoplasm of skin: Secondary | ICD-10-CM | POA: Diagnosis not present

## 2023-05-24 ENCOUNTER — Other Ambulatory Visit: Payer: Self-pay | Admitting: Family Medicine

## 2023-05-24 DIAGNOSIS — E119 Type 2 diabetes mellitus without complications: Secondary | ICD-10-CM

## 2023-10-26 IMAGING — MR MR HEAD WO/W CM
15 series · 48 of 48 positions shown · IV contrast (gadavist)
Comparison: No pertinent prior exams available for comparison.

CLINICAL DATA: Provided history: New onset of headaches after age
48. Positional headache. Morning headache. Headache, new or
worsening, positional. Additional history provided by scanning
technologist: Patient reports headaches and memory loss since having
COVID 2 years ago.

EXAM:
MRI HEAD WITHOUT AND WITH CONTRAST
TECHNIQUE: Multiplanar, multiecho pulse sequences of the brain and surrounding
structures were obtained without and with intravenous contrast.
CONTRAST:  8mL GADAVIST GADOBUTROL 1 MMOL/ML IV SOLN

[Series 5: ax dwi_tracew · axial · 3.0mm · 0.65mm/px · z∈[-99,+54]mm · 4 of 48 slices shown]
[im 1/48]
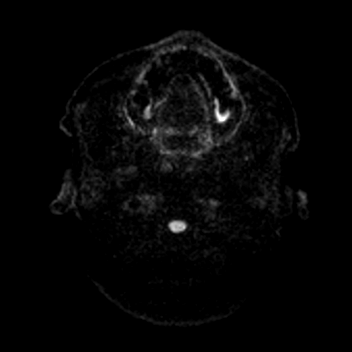
[im 16/48]
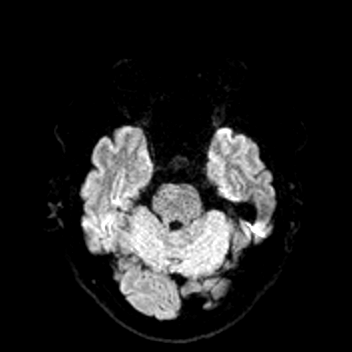
[im 32/48]
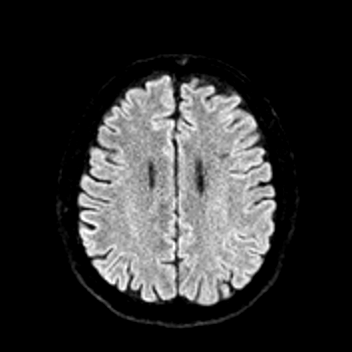
[im 48/48]
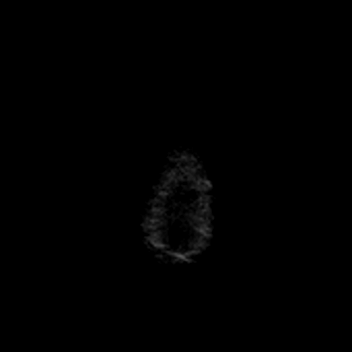

[Series 6: ax dwi_adc · axial · 3.0mm · 0.65mm/px · z∈[-99,+54]mm · 3 of 48 slices shown]
[im 1/48]
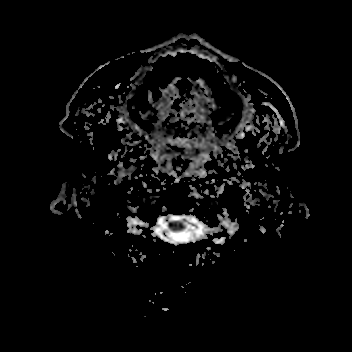
[im 24/48]
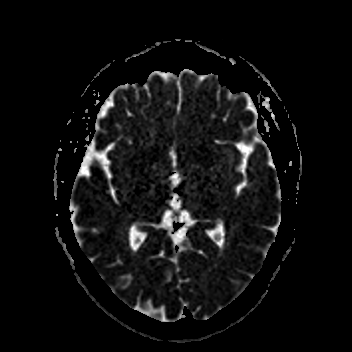
[im 48/48]
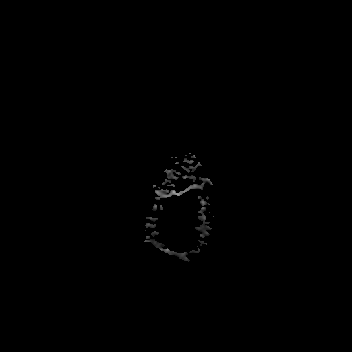

[Series 7: cor dwi_tracew · coronal · 5.0mm · 0.65mm/px · 2 of 40 slices shown]
[im 1/40]
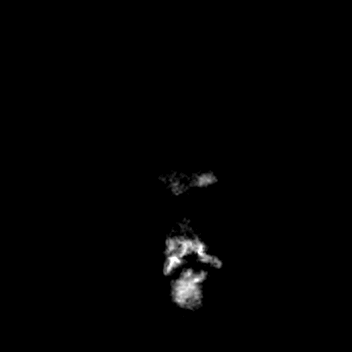
[im 40/40]
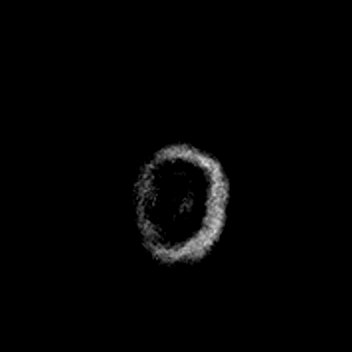

[Series 8: cor dwi_adc · coronal · 5.0mm · 0.65mm/px · 2 of 40 slices shown]
[im 1/40]
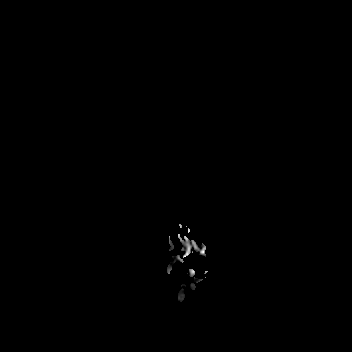
[im 40/40]
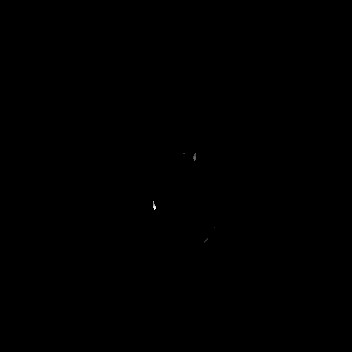

[Series 9: T1 · sagittal · 5.0mm · 0.62mm/px · 1 of 25 slices shown (1 of 2)]
[im 1/25]
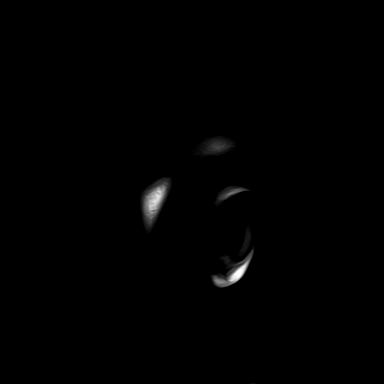

[Series 10: T2 · axial · 5.0mm · 0.53mm/px · 1 of 26 slices shown]
[im 1/26]
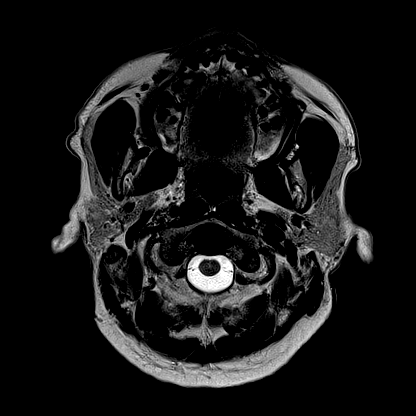

[Series 11: mag_images · axial · 3.0mm · 0.90mm/px · z∈[-110,+65]mm · 3 of 60 slices shown]
[im 1/60]
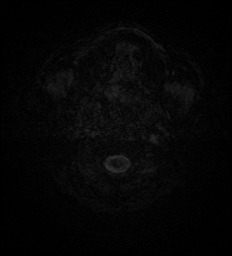
[im 30/60]
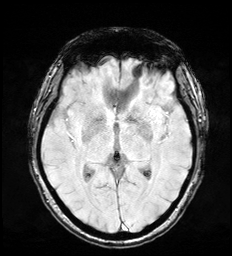
[im 60/60]
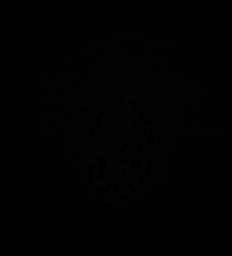

[Series 12: pha_images · axial · 3.0mm · 0.90mm/px · z∈[-110,+62]mm · 3 of 59 slices shown]
[im 1/59]
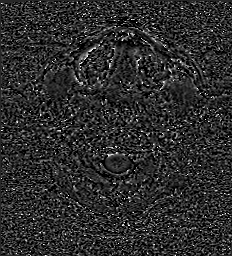
[im 30/59]
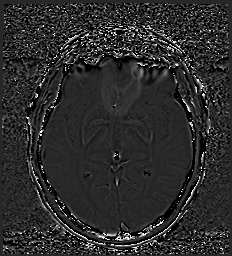
[im 59/59]
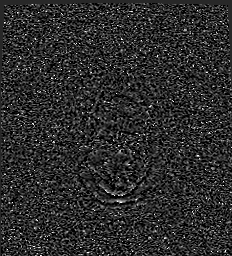

[Series 13: swi_images · axial · 3.0mm · 0.90mm/px · z∈[-110,+65]mm · 3 of 60 slices shown]
[im 1/60]
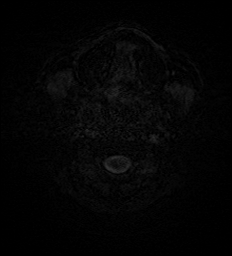
[im 30/60]
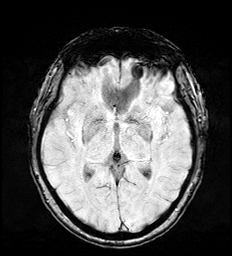
[im 60/60]
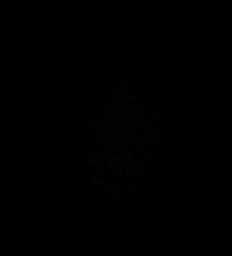

[Series 15: FLAIR · axial · 3.0mm · 0.53mm/px · z∈[-102,+59]mm · 3 of 55 slices shown]
[im 1/55]
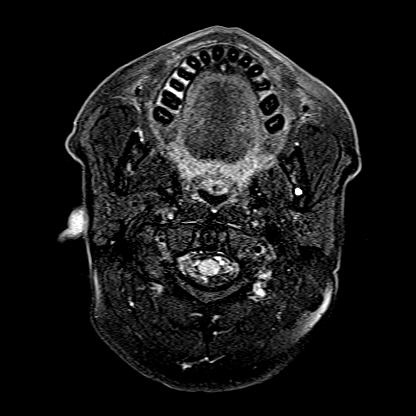
[im 28/55]
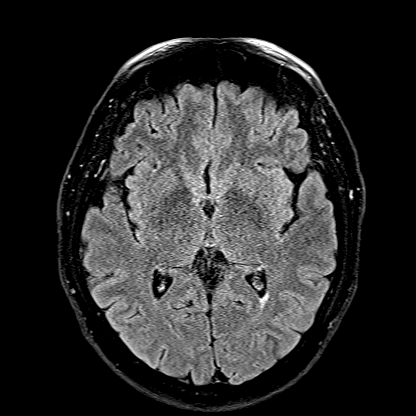
[im 55/55]
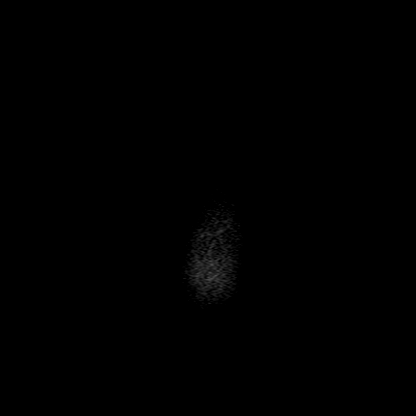

[Series 16: T1 · axial · 1.0mm · 0.98mm/px · z∈[-102,+55]mm · 9 of 160 slices shown (2 of 2)]
[im 1/160]
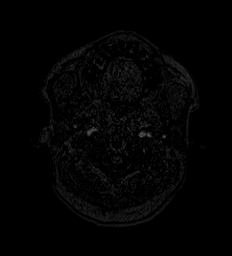
[im 20/160]
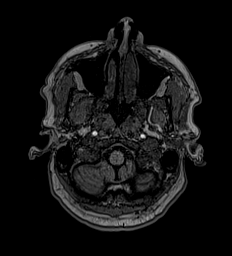
[im 40/160]
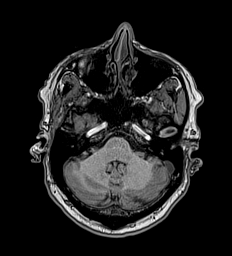
[im 60/160]
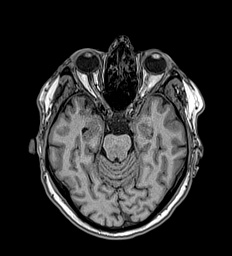
[im 80/160]
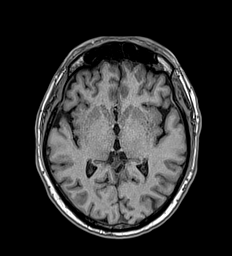
[im 100/160]
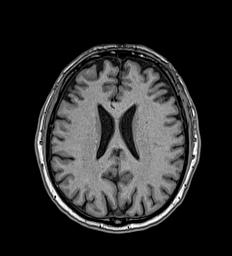
[im 120/160]
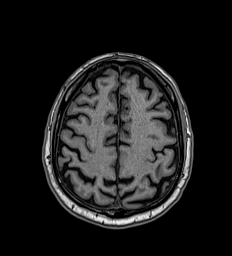
[im 140/160]
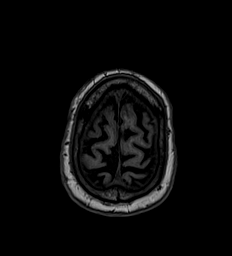
[im 160/160]
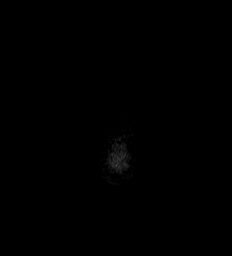

[Series 17: T2 post-contrast · coronal · 5.0mm · 0.57mm/px · 2 of 31 slices shown]
[im 1/31]
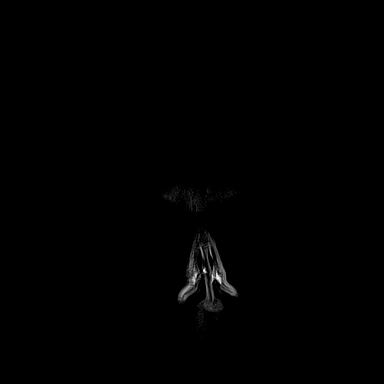
[im 31/31]
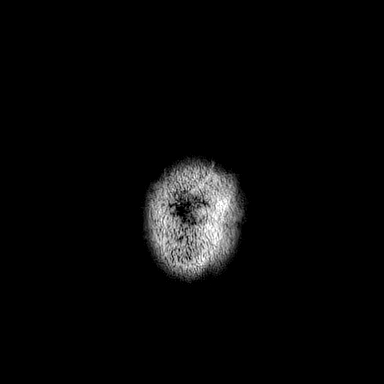

[Series 18: T1 post-contrast · axial · 1.0mm · 0.98mm/px · z∈[-102,+55]mm · 9 of 160 slices shown (1 of 3)]
[im 1/160]
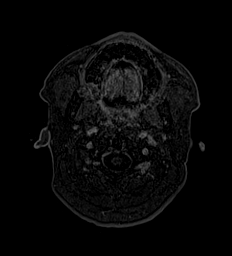
[im 20/160]
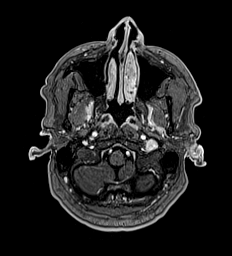
[im 40/160]
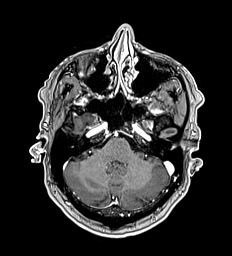
[im 60/160]
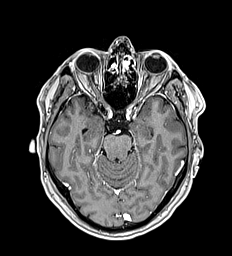
[im 80/160]
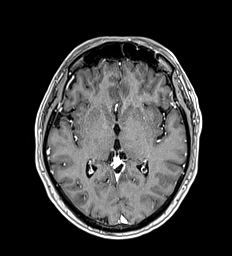
[im 100/160]
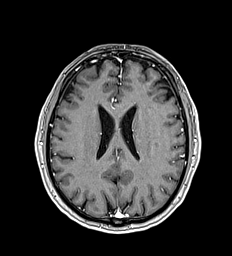
[im 120/160]
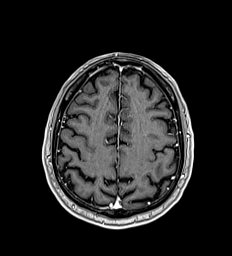
[im 140/160]
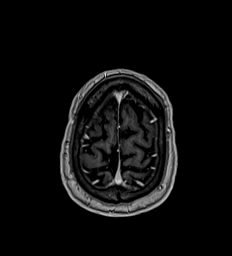
[im 160/160]
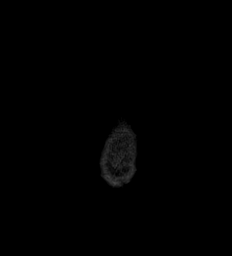

[Series 19: T1 post-contrast · coronal · 5.0mm · 0.57mm/px · 2 of 31 slices shown (2 of 3)]
[im 1/31]
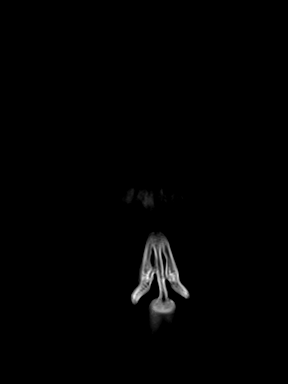
[im 31/31]
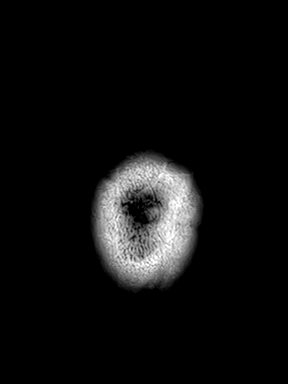

[Series 20: T1 post-contrast · sagittal · 5.0mm · 0.62mm/px · 1 of 25 slices shown (3 of 3)]
[im 1/25]
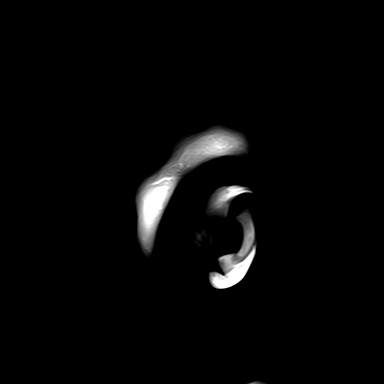

[48 of 48 positions shown; findings below may reference images not displayed]

FINDINGS: Mild intermittent motion degradation.

Brain:

No age advanced or lobar predominant parenchymal atrophy.

Minimal multifocal T2 FLAIR hyperintense signal abnormality within
the cerebral white matter, nonspecific but compatible with chronic
small vessel ischemic disease.

There is no acute infarct.

No evidence of an intracranial mass.

No chronic intracranial blood products.

No extra-axial fluid collection.

No midline shift.

No pathologic intracranial enhancement identified.

Vascular: Maintained flow voids within the proximal large arterial
vessels.

Skull and upper cervical spine: No focal suspicious marrow lesion.

Sinuses/Orbits: No mass or acute finding within the imaged orbits.
Mild mucosal thickening within the left frontal, bilateral ethmoid
and right sphenoid sinuses.
IMPRESSION: No evidence of acute intracranial abnormality.

Minimal chronic small-vessel ischemic changes within the cerebral
white matter.

No age advanced or lobar predominant parenchymal atrophy.

Mild mucosal thickening within the paranasal sinuses.
